# Patient Record
Sex: Female | Born: 1950 | Race: White | Hispanic: No | Marital: Married | Smoking: Never smoker
Health system: Southern US, Community
[De-identification: ages and names within clinical notes are randomized; demographics above are authoritative.]

## PROBLEM LIST (undated history)

## (undated) DIAGNOSIS — T7840XA Allergy, unspecified, initial encounter: Secondary | ICD-10-CM

## (undated) DIAGNOSIS — K449 Diaphragmatic hernia without obstruction or gangrene: Secondary | ICD-10-CM

## (undated) DIAGNOSIS — R059 Cough, unspecified: Secondary | ICD-10-CM

## (undated) DIAGNOSIS — Z0000 Encounter for general adult medical examination without abnormal findings: Secondary | ICD-10-CM

## (undated) DIAGNOSIS — R51 Headache: Secondary | ICD-10-CM

## (undated) DIAGNOSIS — K589 Irritable bowel syndrome without diarrhea: Secondary | ICD-10-CM

## (undated) DIAGNOSIS — I10 Essential (primary) hypertension: Secondary | ICD-10-CM

## (undated) DIAGNOSIS — K219 Gastro-esophageal reflux disease without esophagitis: Secondary | ICD-10-CM

## (undated) DIAGNOSIS — E669 Obesity, unspecified: Secondary | ICD-10-CM

## (undated) DIAGNOSIS — J310 Chronic rhinitis: Secondary | ICD-10-CM

## (undated) DIAGNOSIS — M1990 Unspecified osteoarthritis, unspecified site: Secondary | ICD-10-CM

## (undated) DIAGNOSIS — H269 Unspecified cataract: Secondary | ICD-10-CM

## (undated) DIAGNOSIS — K5792 Diverticulitis of intestine, part unspecified, without perforation or abscess without bleeding: Secondary | ICD-10-CM

## (undated) DIAGNOSIS — K5790 Diverticulosis of intestine, part unspecified, without perforation or abscess without bleeding: Secondary | ICD-10-CM

## (undated) DIAGNOSIS — R05 Cough: Secondary | ICD-10-CM

## (undated) DIAGNOSIS — E785 Hyperlipidemia, unspecified: Secondary | ICD-10-CM

## (undated) HISTORY — DX: Unspecified osteoarthritis, unspecified site: M19.90

## (undated) HISTORY — DX: Diverticulosis of intestine, part unspecified, without perforation or abscess without bleeding: K57.90

## (undated) HISTORY — PX: EYE SURGERY: SHX253

## (undated) HISTORY — DX: Gastro-esophageal reflux disease without esophagitis: K21.9

## (undated) HISTORY — DX: Diverticulitis of intestine, part unspecified, without perforation or abscess without bleeding: K57.92

## (undated) HISTORY — PX: CHOLECYSTECTOMY: SHX55

## (undated) HISTORY — DX: Diaphragmatic hernia without obstruction or gangrene: K44.9

## (undated) HISTORY — DX: Irritable bowel syndrome, unspecified: K58.9

## (undated) HISTORY — DX: Cough, unspecified: R05.9

## (undated) HISTORY — DX: Hyperlipidemia, unspecified: E78.5

## (undated) HISTORY — DX: Encounter for general adult medical examination without abnormal findings: Z00.00

## (undated) HISTORY — DX: Obesity, unspecified: E66.9

## (undated) HISTORY — PX: BILATERAL CARPAL TUNNEL RELEASE: SHX6508

## (undated) HISTORY — DX: Cough: R05

## (undated) HISTORY — DX: Allergy, unspecified, initial encounter: T78.40XA

## (undated) HISTORY — PX: BACK SURGERY: SHX140

## (undated) HISTORY — PX: COLONOSCOPY: SHX174

## (undated) HISTORY — DX: Essential (primary) hypertension: I10

## (undated) HISTORY — DX: Headache: R51

## (undated) HISTORY — DX: Unspecified cataract: H26.9

## (undated) HISTORY — DX: Chronic rhinitis: J31.0

---

## 1998-01-14 ENCOUNTER — Ambulatory Visit (HOSPITAL_COMMUNITY): Admission: RE | Admit: 1998-01-14 | Discharge: 1998-01-14 | Payer: Self-pay | Admitting: Neurosurgery

## 1998-01-14 ENCOUNTER — Encounter: Payer: Self-pay | Admitting: Neurosurgery

## 1998-01-31 ENCOUNTER — Ambulatory Visit (HOSPITAL_COMMUNITY): Admission: RE | Admit: 1998-01-31 | Discharge: 1998-01-31 | Payer: Self-pay | Admitting: Neurosurgery

## 1998-01-31 ENCOUNTER — Encounter: Payer: Self-pay | Admitting: Neurosurgery

## 1998-03-19 ENCOUNTER — Inpatient Hospital Stay (HOSPITAL_COMMUNITY): Admission: RE | Admit: 1998-03-19 | Discharge: 1998-03-21 | Payer: Self-pay | Admitting: Neurosurgery

## 1998-03-19 ENCOUNTER — Encounter: Payer: Self-pay | Admitting: Neurosurgery

## 1998-05-04 ENCOUNTER — Encounter: Payer: Self-pay | Admitting: Neurosurgery

## 1998-05-04 ENCOUNTER — Ambulatory Visit (HOSPITAL_COMMUNITY): Admission: RE | Admit: 1998-05-04 | Discharge: 1998-05-04 | Payer: Self-pay | Admitting: Neurosurgery

## 1998-06-17 ENCOUNTER — Ambulatory Visit (HOSPITAL_COMMUNITY): Admission: RE | Admit: 1998-06-17 | Discharge: 1998-06-17 | Payer: Self-pay | Admitting: Neurosurgery

## 1998-06-17 ENCOUNTER — Encounter: Payer: Self-pay | Admitting: Neurosurgery

## 2000-03-06 ENCOUNTER — Ambulatory Visit (HOSPITAL_COMMUNITY): Admission: RE | Admit: 2000-03-06 | Discharge: 2000-03-06 | Payer: Self-pay | Admitting: Obstetrics and Gynecology

## 2000-12-06 ENCOUNTER — Other Ambulatory Visit: Admission: RE | Admit: 2000-12-06 | Discharge: 2000-12-06 | Payer: Self-pay | Admitting: Internal Medicine

## 2001-04-20 ENCOUNTER — Ambulatory Visit (HOSPITAL_COMMUNITY): Admission: RE | Admit: 2001-04-20 | Discharge: 2001-04-20 | Payer: Self-pay | Admitting: Internal Medicine

## 2001-12-06 ENCOUNTER — Ambulatory Visit (HOSPITAL_COMMUNITY): Admission: RE | Admit: 2001-12-06 | Discharge: 2001-12-06 | Payer: Self-pay | Admitting: Internal Medicine

## 2002-03-24 ENCOUNTER — Emergency Department (HOSPITAL_COMMUNITY): Admission: EM | Admit: 2002-03-24 | Discharge: 2002-03-24 | Payer: Self-pay | Admitting: Emergency Medicine

## 2002-03-25 ENCOUNTER — Ambulatory Visit (HOSPITAL_COMMUNITY): Admission: RE | Admit: 2002-03-25 | Discharge: 2002-03-25 | Payer: Self-pay | Admitting: Emergency Medicine

## 2002-03-25 ENCOUNTER — Encounter: Payer: Self-pay | Admitting: Emergency Medicine

## 2002-04-03 ENCOUNTER — Encounter (INDEPENDENT_AMBULATORY_CARE_PROVIDER_SITE_OTHER): Payer: Self-pay | Admitting: Specialist

## 2002-04-03 ENCOUNTER — Encounter: Payer: Self-pay | Admitting: General Surgery

## 2002-04-03 ENCOUNTER — Ambulatory Visit (HOSPITAL_COMMUNITY): Admission: RE | Admit: 2002-04-03 | Discharge: 2002-04-04 | Payer: Self-pay | Admitting: General Surgery

## 2002-04-06 ENCOUNTER — Encounter: Payer: Self-pay | Admitting: Surgery

## 2002-04-06 ENCOUNTER — Observation Stay (HOSPITAL_COMMUNITY): Admission: EM | Admit: 2002-04-06 | Discharge: 2002-04-07 | Payer: Self-pay | Admitting: Emergency Medicine

## 2004-04-06 ENCOUNTER — Ambulatory Visit: Payer: Self-pay | Admitting: Internal Medicine

## 2004-04-12 ENCOUNTER — Ambulatory Visit: Payer: Self-pay | Admitting: Internal Medicine

## 2004-05-06 ENCOUNTER — Ambulatory Visit: Payer: Self-pay | Admitting: Internal Medicine

## 2004-05-16 ENCOUNTER — Emergency Department (HOSPITAL_COMMUNITY): Admission: EM | Admit: 2004-05-16 | Discharge: 2004-05-16 | Payer: Self-pay | Admitting: Emergency Medicine

## 2004-05-17 ENCOUNTER — Ambulatory Visit: Payer: Self-pay | Admitting: Internal Medicine

## 2004-05-19 ENCOUNTER — Ambulatory Visit: Payer: Self-pay | Admitting: Internal Medicine

## 2004-05-25 ENCOUNTER — Ambulatory Visit (HOSPITAL_COMMUNITY): Admission: RE | Admit: 2004-05-25 | Discharge: 2004-05-25 | Payer: Self-pay | Admitting: Internal Medicine

## 2004-06-08 ENCOUNTER — Ambulatory Visit: Payer: Self-pay | Admitting: Internal Medicine

## 2004-06-29 ENCOUNTER — Ambulatory Visit: Payer: Self-pay | Admitting: Internal Medicine

## 2005-05-31 ENCOUNTER — Ambulatory Visit: Payer: Self-pay | Admitting: Internal Medicine

## 2005-07-29 ENCOUNTER — Ambulatory Visit (HOSPITAL_COMMUNITY): Admission: RE | Admit: 2005-07-29 | Discharge: 2005-07-29 | Payer: Self-pay | Admitting: Internal Medicine

## 2006-03-20 ENCOUNTER — Ambulatory Visit: Payer: Self-pay | Admitting: Internal Medicine

## 2006-04-03 ENCOUNTER — Ambulatory Visit: Payer: Self-pay | Admitting: Internal Medicine

## 2006-05-18 ENCOUNTER — Ambulatory Visit: Payer: Self-pay | Admitting: Internal Medicine

## 2006-05-18 LAB — CONVERTED CEMR LAB
AST: 28 units/L (ref 0–37)
Albumin: 4 g/dL (ref 3.5–5.2)
Alkaline Phosphatase: 81 units/L (ref 39–117)
Basophils Relative: 0.9 % (ref 0.0–1.0)
Bilirubin, Direct: 0.1 mg/dL (ref 0.0–0.3)
Calcium: 9.4 mg/dL (ref 8.4–10.5)
Chloride: 107 meq/L (ref 96–112)
Cholesterol: 246 mg/dL (ref 0–200)
Direct LDL: 172.8 mg/dL
Eosinophils Relative: 2.5 % (ref 0.0–5.0)
HDL: 45.7 mg/dL (ref >39.0)
LDL Cholesterol: 165 mg/dL — ABNORMAL HIGH (ref 0–99)
Lymphocytes Relative: 26.6 % (ref 12.0–46.0)
MCHC: 34.3 g/dL (ref 30.0–36.0)
Monocytes Absolute: 0.5 10*3/uL (ref 0.2–0.7)
Neutro Abs: 4.3 10*3/uL (ref 1.4–7.7)
Neutrophils Relative %: 62.5 % (ref 43.0–77.0)
RBC: 4.86 M/uL (ref 3.87–5.11)
TSH: 1.45 microintl units/mL (ref 0.35–5.50)
Total Protein: 7.2 g/dL (ref 6.0–8.3)
Triglycerides: 177 mg/dL — ABNORMAL HIGH (ref 0–149)

## 2006-06-30 ENCOUNTER — Ambulatory Visit: Payer: Self-pay | Admitting: Internal Medicine

## 2006-08-10 ENCOUNTER — Ambulatory Visit (HOSPITAL_COMMUNITY): Admission: RE | Admit: 2006-08-10 | Discharge: 2006-08-10 | Payer: Self-pay | Admitting: Internal Medicine

## 2006-09-01 ENCOUNTER — Ambulatory Visit: Payer: Self-pay | Admitting: Internal Medicine

## 2007-01-01 ENCOUNTER — Ambulatory Visit: Payer: Self-pay | Admitting: Pulmonary Disease

## 2007-01-18 DIAGNOSIS — E785 Hyperlipidemia, unspecified: Secondary | ICD-10-CM | POA: Insufficient documentation

## 2007-01-18 DIAGNOSIS — R519 Headache, unspecified: Secondary | ICD-10-CM | POA: Insufficient documentation

## 2007-01-18 DIAGNOSIS — K449 Diaphragmatic hernia without obstruction or gangrene: Secondary | ICD-10-CM | POA: Insufficient documentation

## 2007-01-18 DIAGNOSIS — J310 Chronic rhinitis: Secondary | ICD-10-CM

## 2007-01-18 DIAGNOSIS — R05 Cough: Secondary | ICD-10-CM

## 2007-01-18 DIAGNOSIS — R51 Headache: Secondary | ICD-10-CM | POA: Insufficient documentation

## 2007-01-18 DIAGNOSIS — K589 Irritable bowel syndrome without diarrhea: Secondary | ICD-10-CM

## 2007-01-18 DIAGNOSIS — E669 Obesity, unspecified: Secondary | ICD-10-CM | POA: Insufficient documentation

## 2007-01-23 ENCOUNTER — Telehealth (INDEPENDENT_AMBULATORY_CARE_PROVIDER_SITE_OTHER): Payer: Self-pay | Admitting: *Deleted

## 2007-04-23 ENCOUNTER — Encounter: Payer: Self-pay | Admitting: Internal Medicine

## 2007-05-10 ENCOUNTER — Ambulatory Visit (HOSPITAL_BASED_OUTPATIENT_CLINIC_OR_DEPARTMENT_OTHER): Admission: RE | Admit: 2007-05-10 | Discharge: 2007-05-10 | Payer: Self-pay | Admitting: Orthopedic Surgery

## 2007-06-07 ENCOUNTER — Encounter: Payer: Self-pay | Admitting: Internal Medicine

## 2007-06-07 ENCOUNTER — Ambulatory Visit (HOSPITAL_BASED_OUTPATIENT_CLINIC_OR_DEPARTMENT_OTHER): Admission: RE | Admit: 2007-06-07 | Discharge: 2007-06-07 | Payer: Self-pay | Admitting: Orthopedic Surgery

## 2007-08-03 ENCOUNTER — Ambulatory Visit: Payer: Self-pay | Admitting: Internal Medicine

## 2007-08-03 DIAGNOSIS — L259 Unspecified contact dermatitis, unspecified cause: Secondary | ICD-10-CM

## 2007-08-14 ENCOUNTER — Ambulatory Visit (HOSPITAL_COMMUNITY): Admission: RE | Admit: 2007-08-14 | Discharge: 2007-08-14 | Payer: Self-pay | Admitting: Internal Medicine

## 2007-12-25 ENCOUNTER — Ambulatory Visit: Payer: Self-pay | Admitting: Internal Medicine

## 2007-12-25 DIAGNOSIS — M25559 Pain in unspecified hip: Secondary | ICD-10-CM | POA: Insufficient documentation

## 2008-05-19 ENCOUNTER — Ambulatory Visit: Payer: Self-pay | Admitting: Internal Medicine

## 2008-05-19 ENCOUNTER — Telehealth (INDEPENDENT_AMBULATORY_CARE_PROVIDER_SITE_OTHER): Payer: Self-pay | Admitting: *Deleted

## 2008-11-06 ENCOUNTER — Encounter: Payer: Self-pay | Admitting: Internal Medicine

## 2009-01-28 ENCOUNTER — Encounter (INDEPENDENT_AMBULATORY_CARE_PROVIDER_SITE_OTHER): Payer: Self-pay | Admitting: *Deleted

## 2009-05-20 ENCOUNTER — Ambulatory Visit: Payer: Self-pay | Admitting: Internal Medicine

## 2009-05-20 DIAGNOSIS — D179 Benign lipomatous neoplasm, unspecified: Secondary | ICD-10-CM | POA: Insufficient documentation

## 2009-05-20 DIAGNOSIS — I10 Essential (primary) hypertension: Secondary | ICD-10-CM

## 2009-05-21 LAB — CONVERTED CEMR LAB
ALT: 30 units/L (ref 0–35)
AST: 24 units/L (ref 0–37)
Alkaline Phosphatase: 72 units/L (ref 39–117)
Basophils Relative: 0.4 % (ref 0.0–3.0)
CO2: 32 meq/L (ref 19–32)
Chloride: 105 meq/L (ref 96–112)
Cholesterol: 225 mg/dL — ABNORMAL HIGH (ref 0–200)
Eosinophils Absolute: 0.1 10*3/uL (ref 0.0–0.7)
Eosinophils Relative: 1.4 % (ref 0.0–5.0)
GFR calc non Af Amer: 77.55 mL/min (ref >60)
HDL: 49.7 mg/dL (ref >39.00)
Lymphocytes Relative: 28.7 % (ref 12.0–46.0)
MCHC: 34.2 g/dL (ref 30.0–36.0)
MCV: 91.2 fL (ref 78.0–100.0)
Monocytes Relative: 7 % (ref 3.0–12.0)
Platelets: 234 10*3/uL (ref 150.0–400.0)
Potassium: 4 meq/L (ref 3.5–5.1)
RBC: 4.66 M/uL (ref 3.87–5.11)
RDW: 13.2 % (ref 11.5–14.6)
Sodium: 144 meq/L (ref 135–145)
Total Bilirubin: 0.6 mg/dL (ref 0.3–1.2)
Total Protein: 7.1 g/dL (ref 6.0–8.3)
Triglycerides: 131 mg/dL (ref 0.0–149.0)

## 2009-05-25 ENCOUNTER — Encounter (INDEPENDENT_AMBULATORY_CARE_PROVIDER_SITE_OTHER): Payer: Self-pay | Admitting: *Deleted

## 2009-05-27 ENCOUNTER — Telehealth (INDEPENDENT_AMBULATORY_CARE_PROVIDER_SITE_OTHER): Payer: Self-pay | Admitting: *Deleted

## 2009-06-23 ENCOUNTER — Encounter (INDEPENDENT_AMBULATORY_CARE_PROVIDER_SITE_OTHER): Payer: Self-pay | Admitting: *Deleted

## 2009-06-25 ENCOUNTER — Ambulatory Visit: Payer: Self-pay | Admitting: Gastroenterology

## 2009-07-02 ENCOUNTER — Telehealth: Payer: Self-pay | Admitting: Internal Medicine

## 2009-07-09 ENCOUNTER — Ambulatory Visit: Payer: Self-pay | Admitting: Gastroenterology

## 2009-07-13 ENCOUNTER — Encounter: Payer: Self-pay | Admitting: Gastroenterology

## 2009-07-21 ENCOUNTER — Encounter: Payer: Self-pay | Admitting: Internal Medicine

## 2009-08-24 ENCOUNTER — Ambulatory Visit: Payer: Self-pay | Admitting: Internal Medicine

## 2009-08-24 LAB — CONVERTED CEMR LAB
ALT: 25 units/L (ref 0–35)
Alkaline Phosphatase: 79 units/L (ref 39–117)
Basophils Relative: 0.4 % (ref 0.0–3.0)
Bilirubin, Direct: 0 mg/dL (ref 0.0–0.3)
Chloride: 109 meq/L (ref 96–112)
Eosinophils Relative: 1.9 % (ref 0.0–5.0)
GFR calc non Af Amer: 76.38 mL/min (ref >60)
Glucose, Bld: 101 mg/dL — ABNORMAL HIGH (ref 70–99)
HCT: 42.2 % (ref 36.0–46.0)
HDL: 41.7 mg/dL (ref >39.00)
Hemoglobin: 14.5 g/dL (ref 12.0–15.0)
Monocytes Absolute: 0.6 10*3/uL (ref 0.1–1.0)
Monocytes Relative: 7.9 % (ref 3.0–12.0)
Neutrophils Relative %: 60.9 % (ref 43.0–77.0)
Platelets: 234 10*3/uL (ref 150.0–400.0)
RDW: 12.7 % (ref 11.5–14.6)
Specific Gravity, Urine: 1.02 (ref 1.000–1.030)
TSH: 1.78 microintl units/mL (ref 0.35–5.50)
Total CHOL/HDL Ratio: 6
Total Protein, Urine: NEGATIVE mg/dL
Total Protein: 7 g/dL (ref 6.0–8.3)
Triglycerides: 221 mg/dL — ABNORMAL HIGH (ref 0.0–149.0)
Urine Glucose: NEGATIVE mg/dL

## 2009-08-25 LAB — CONVERTED CEMR LAB: Vit D, 25-Hydroxy: 42 ng/mL (ref 30–89)

## 2009-11-12 ENCOUNTER — Encounter: Payer: Self-pay | Admitting: Internal Medicine

## 2010-03-14 LAB — CONVERTED CEMR LAB
AST: 21 units/L (ref 0–37)
Albumin: 3.7 g/dL (ref 3.5–5.2)
Alkaline Phosphatase: 66 units/L (ref 39–117)
Basophils Absolute: 0 10*3/uL (ref 0.0–0.1)
Basophils Relative: 0.4 % (ref 0.0–3.0)
Bilirubin, Direct: 0.1 mg/dL (ref 0.0–0.3)
Calcium: 9.1 mg/dL (ref 8.4–10.5)
Chloride: 108 meq/L (ref 96–112)
Eosinophils Absolute: 0.1 10*3/uL (ref 0.0–0.7)
Eosinophils Relative: 2.2 % (ref 0.0–5.0)
GFR calc non Af Amer: 91 mL/min
HCT: 41.7 % (ref 36.0–46.0)
HDL: 34.4 mg/dL — ABNORMAL LOW (ref >39.0)
MCHC: 33.8 g/dL (ref 30.0–36.0)
Neutro Abs: 3.9 10*3/uL (ref 1.4–7.7)
Platelets: 207 10*3/uL (ref 150–400)
RBC: 4.51 M/uL (ref 3.87–5.11)
Sodium: 143 meq/L (ref 135–145)
Triglycerides: 107 mg/dL (ref 0–149)

## 2010-03-16 NOTE — Assessment & Plan Note (Signed)
Summary: Primary svc/ cpx     Primary Provider/Referring Provider:  [name]   History of Present Illness: 69  yowf never smoker with moderated obesity and tendency to cyclical coughing and both chronic and seasonal rhinitis.  December 25, 2007 CPX:  co worsening nasal congestion with no response to clariton, afrin. Does not remember previous disucussion of nasal steroids or flyer reviewed in 2006.  No ha or purulent secretions, sob, cp or sign increase cough, fever, ear problems. rec optimal topical rx with nasal steroids.   May 19, 2008 ov 4/1 cleaned back yard, then 4/3 broke out in itching rash mostly right arm and around right eyelid.  No sob, cough. rx as contact dermatitis and resolved.  May 20, 2009 Yearly followup.  Pt c/o sneezing and runny nose x several days.  She c/o "knot" on her right side she noticed a few months ago.   imp ? hernia s/p remote lap chole and elevated so watch salt   August 24, 2009 cpx no new c/os - rlight uq tingling twice monthly lasts a few hours, no pattern.  Current Medications (verified): 1)  Zoloft 100 Mg  Tabs (Sertraline Hcl) .... Take 1 Tablet By Mouth Once A Day 2)  Protonix 40 Mg  Tbec (Pantoprazole Sodium) .... Take  One 30-60 Min Before First Meal of The Day 3)  Multivitamins  Tabs (Multiple Vitamin) .Marland Kitchen.. 1 Once Daily 4)  Caltrate 600 1500 Mg Tabs (Calcium Carbonate) .Marland Kitchen.. 1 Once Daily 5)  Nasonex 50 Mcg/act  Susp (Mometasone Furoate) .... Two Puffs Each Nostril Twice Daily As Needed 6)  Delsym 30 Mg/6ml  Lqcr (Dextromethorphan Polistirex) .... As Needed 7)  Ibuprofen 800 Mg  Tabs (Ibuprofen) .... Three Times A Day As Needed 8)  Claritin 10 Mg  Tabs (Loratadine) .... Take 1 Tablet Daily As Needed For Itching Sneezing and Runny Nose 9)  B Complex-B12  Tabs (B Complex Vitamins) .Marland Kitchen.. 1 Once Daily  Allergies (verified): 1)  ! Prozac  Past History:  Past Medical History: 1. Obesity with an ideal weight less than 171, target less than 190 to  get BMI < 30 2. Nonspecific rhinitis      - CRF given 04/06/04      - CRF repeated December 25, 2007  3. Cyclical cough felt to be partially reflux in the past; see     endoscopy January 2001 consistent with a small hiatal hernia. 4aHiatal hernia. Pos egd 1/01................................................................Marland KitchenStark 4b Diverticulosis pos colonoscopy 02/1999............................................Marland KitchenStark 5. Irritable bowel syndrome. 6. Hyperlipidemia, target LDL less than 160 based on the absence of     identifiable risk factors. 7. Recent headaches (see most recent office visit April 03, 2006),     completely resolved with Midrin and does not require daily dosing. 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 Nl external ear canals without cough reflex, the right ear was examined twice with no findings and note pain on retraction of the earlobe or reproduced by opening the jaw Neck without JVD/Nodes/TM, but there was  subtle soft tissue swelling below the right ear. Lungs clear to A and P bilaterally without cough on insp or exp maneuvers RRR no s3 or murmur or increase in P2 Abd soft and benign with nl excursion in the supine position. No bruits or organomegaly Skin with nodule right flank large margle sized, non tender, no discoloration Cholesterol          [H]  246 mg/dL                   4-098     ATP III Classification            Desirable:  < 200 mg/dL                    Borderline High:  200 - 239 mg/dL               High:  > = 240 mg/dL   Triglycerides        [H]  221.0 mg/dL                 1.1-914.7     Normal:  <150 mg/dL     Borderline High:  829 - 199 mg/dL   HDL                       56.21 mg/dL                 >30.86   VLDL Cholesterol     [H]  44.2 mg/dL                  5.7-84.6  CHO/HDL Ratio:  CHD Risk                             6                    Men          Women     1/2 Average Risk     3.4          3.3     Average Risk          5.0          4.4     2X Average Risk          9.6          7.1     3X Average Risk          15.0          11.0  Tests: (2) BMP (METABOL)   Sodium                    144 mEq/L                   135-145   Potassium                 4.5 mEq/L                   3.5-5.1   Chloride                  109 mEq/L                   96-112   Carbon Dioxide       [H]  33 mEq/L                    19-32   Glucose              [H]  101 mg/dL                   82-95   BUN                       12 mg/dL                    6-21   Creatinine                0.8 mg/dL                   3.0-8.6   Calcium                   9.2 mg/dL                   5.7-84.6   GFR                       76.38 mL/min                >60  Tests: (3) CBC Platelet w/Diff (CBCD)   White Cell Count          8.2 K/uL                    4.5-10.5   Red Cell Count            4.53 Mil/uL                 3.87-5.11   Hemoglobin                14.5 g/dL                    96.2-95.2   Hematocrit                42.2 %                      36.0-46.0   MCV                       93.1 fl                     78.0-100.0   MCHC  34.4 g/dL                   16.1-09.6   RDW                       12.7 %                      11.5-14.6   Platelet Count            234.0 K/uL                  150.0-400.0   Neutrophil %              60.9 %                      43.0-77.0   Lymphocyte %              28.9 %                      12.0-46.0   Monocyte %                7.9 %                       3.0-12.0   Eosinophils%              1.9 %                       0.0-5.0   Basophils %               0.4 %                       0.0-3.0   Neutrophill Absolute      5.0 K/uL                    1.4-7.7   Lymphocyte Absolute       2.4 K/uL                    0.7-4.0   Monocyte Absolute         0.6 K/uL                    0.1-1.0  Eosinophils, Absolute                             0.2 K/uL                    0.0-0.7   Basophils Absolute        0.0 K/uL                    0.0-0.1  Tests: (4) Hepatic/Liver Function Panel (HEPATIC)   Total Bilirubin           0.5 mg/dL                   0.4-5.4   Direct Bilirubin          0.0 mg/dL                   0.9-8.1   Alkaline Phosphatase      79 U/L  39-117   AST                       21 U/L                      0-37   ALT                       25 U/L                      0-35   Total Protein             7.0 g/dL                    0.9-8.1   Albumin                   4.1 g/dL                    1.9-1.4  Tests: (5) TSH (TSH)   FastTSH                   1.78 uIU/mL                 0.35-5.50  Tests: (6) UDip Only (UDIP)   Color                     YELLOW       RANGE:  Yellow;Lt. Yellow   Clarity                   CLEAR                       Clear   Specific Gravity          1.020                       1.000 - 1.030   Urine Ph                  7.0                         5.0-8.0   Protein                    NEGATIVE                    Negative   Urine Glucose             NEGATIVE                    Negative   Ketones                   NEGATIVE                    Negative   Urine Bilirubin           NEGATIVE                    Negative   Blood                     NEGATIVE                    Negative   Urobilinogen  0.2                         0.0 - 1.0   Leukocyte Esterace        NEGATIVE                    Negative   Nitrite                   NEGATIVE                    Negative  Tests: (7) Cholesterol LDL - Direct (DIRLDL)  Cholesterol LDL - Direct                             165.4 mg/dL  Impression & Recommendations:  Problem # 1:  HYPERLIPIDEMIA (ICD-272.4) 6. Hyperlipidemia, target LDL less than 160 based on the absence of     identifiable risk factors. Labs Reviewed: SGOT: 24 (05/20/2009)   SGPT: 30 (05/20/2009)   HDL:49.70 (05/20/2009), 34.4 (12/25/2007)  LDL:132 (12/25/2007), 165 (05/18/2006) >  LDL  165 August 24, 2009   needs diet and ex   Chol:225 (05/20/2009), 188 (12/25/2007)  Trig:131.0 (05/20/2009), 107 (12/25/2007)  Problem # 2:  RHINITIS (ICD-472.0) rx reviewed  Problem # 3:  HIATAL HERNIA (ICD-553.3)  Her updated medication list for this problem includes:    Protonix 40 Mg Tbec (Pantoprazole sodium) .Marland Kitchen... Take  one 30-60 min before first meal of the day  EGD reviewed rx per Dr Russella Dar  Other Orders: EKG w/ Interpretation (93000) T-Vitamin D (25-Hydroxy) ([address]-60[address]) TLB-Lipid Panel (80061-LIPID) TLB-BMP (Basic Metabolic Panel-BMET) (80048-METABOL) TLB-CBC Platelet - w/Differential (85025-CBCD) TLB-Hepatic/Liver Function Pnl (80076-HEPATIC) TLB-TSH (Thyroid Stimulating Hormone) (84443-TSH) TLB-Udip ONLY (81003-UDIP) Est. Patient 40-64 years (09811)  Patient Instructions: 1)  We can do a formulary substitution for your protonix or will need prior approval by Dr Russella Dar or can use prilosec otc 20 mg per day as an alternative 2)  Weight control is simply  a matter of calorie balance which needs to be tilted in your favor by eating less and exercising more.  To get the most out of exercise, you need to be continuously aware that you are short of breath, but never out of breath, for 30 minutes daily. As you improve, it will actually be easier for you to do the same amount in  30 minutes so always push to the level where you are short of breath.  If this does not result in gradual weight reduction,  I recommend  a nutritionist for a food diary 3)  If need more than 800 advil a day consistently see Dr Renae Fickle 4)  Call [phone] for your results w/in next 3 days - if there's something important  I feel you need to know,  I'll be in touch with you directly.    [facility] ECG  ID: [id number] Patient: Savannah [name], Savannah [name] DOB: 05-16-1950 Age: 60 Years Old Sex: Female Race: White Height: 68 Weight: 206 Status: Unconfirmed Past Medical History:  1. Obesity with an ideal weight less than 171, target less than 190 to get BMI < 30 2. Nonspecific rhinitis      - CRF given 04/06/04      - CRF repeated December 25, 2007  3. Cyclical cough felt to be partially reflux in the past; see     endoscopy January 2001 consistent with a small hiatal hernia.  4aHiatal hernia. Pos egd 1/01................................................................Marland KitchenStark 4b Diverticulosis pos colonoscopy 02/1999............................................Marland KitchenStark 5. Irritable bowel syndrome. 6. Hyperlipidemia, target LDL less than 160 based on the absence of     identifiable risk factors. 7. Recent headaches (see most recent office visit April 03, 2006),     completely resolved with Midrin and does not require daily dosing. HEALTH MAINTENANCE........................................................................Marland KitchenWert     - TD 2/05    - Pneumovax 4/07    -  DEXA 5/08 wnl    - CPX  12/25/2007    - GYN rx per Ambrose Mantle   Recorded: 08/24/2009 09:10 AM P/PR: 120 ms / 177 ms - Heart rate  (maximum exercise) QRS: 90 QT/QTc/QTd: 421 ms / 423 ms / 95 ms - Heart rate (maximum exercise)  P/QRS/T axis: 21 deg / -29 deg / 38 deg - Heart rate (maximum exercise)  Heartrate: 61 bpm  Interpretation:   sinus rhythm  horizontal axis   Normal variant of ECG

## 2010-03-16 NOTE — Progress Notes (Signed)
Summary: Maura Crandall for Pantoprazole sodium   Phone Note Outgoing Call   Call placed by: Carver Fila Jul 02, 2009 3:31 PM Summary of Call: Attempt to call twice but no answer to [employer] for prior auth on Pantoprazole Sodium 40mg  Carver Fila SMA  Jul 02, 2009 3:32 PM   Follow-up for Phone Call        Called [employer] to initiate PA for pantoprazole 40mg .  Awaiting on form to be sent to triage.  Gweneth Dimitri RN  Jul 03, 2009 4:47 PM  Form received and placed in MW's to do pile.  Gweneth Dimitri RN  Jul 03, 2009 4:56 PM   approval form has been reveived. [facility] and pt notified. Carron Curie CMA  July 16, 2009 3:02 PM

## 2010-03-16 NOTE — Letter (Signed)
Summary: Previsit letter  [facility]  [address] [place], [place] [address]   Phone: [phone]  Fax: [phone]       05/25/2009 MRN: [id number]  Savannah [name] [address] [place], [place]  [address]  Dear Ms. Beckstrom,  Welcome to the Gastroenterology Division at [facility].    You are scheduled to see a nurse for your pre-procedure visit on 06-25-09 at 4:30pm on the 3rd floor at [facility], [address]  We ask that you try to arrive at our office 15 minutes prior to your appointment time to allow for check-in.  Your nurse visit will consist of discussing your medical and surgical history, your immediate family medical history, and your medications.    Please bring a complete list of all your medications or, if you prefer, bring the medication bottles and we will list them.  We will need to be aware of both prescribed and over the counter drugs.  We will need to know exact dosage information as well.  If you are on blood thinners (Coumadin, Plavix, Aggrenox, Ticlid, etc.) please call our office today/prior to your appointment, as we need to consult with your physician about holding your medication.   Please be prepared to read and sign documents such as consent forms, a financial agreement, and acknowledgement forms.  If necessary, and with your consent, a friend or relative is welcome to sit-in on the nurse visit with you.  Please bring your insurance card so that we may make a copy of it.  If your insurance requires a referral to see a specialist, please bring your referral form from your primary care physician.  No co-pay is required for this nurse visit.     If you cannot keep your appointment, please call [phone] to cancel or reschedule prior to your appointment date.  This allows [place] the opportunity to schedule an appointment for another patient in need of care.    Thank you for choosing [facility] for your medical needs.   We appreciate the opportunity to care for you.  Please visit [place] at our website  to learn more about our practice.                     Sincerely.                                                                                                                   The Gastroenterology Division

## 2010-03-16 NOTE — Letter (Signed)
Summary: Patient Notice- Polyp Results  [facility]  [address] [place], [place] [address]   Phone: [phone]  Fax: [phone]        Jul 13, 2009 MRN: [id number]    Savannah [name] [address] [place], [place]  [address]    Dear [name],  I am pleased to inform you that the colon polyp(s) removed during your recent colonoscopy was (were) found to be benign (no cancer detected) upon pathologic examination.  I recommend you have a repeat colonoscopy examination in 3 years to look for recurrent polyps, as having colon polyps increases your risk for having recurrent polyps or even colon cancer in the future.  Should you develop new or worsening symptoms of abdominal pain, bowel habit changes or bleeding from the rectum or bowels, please schedule an evaluation with either your primary care physician or with me.  Continue treatment plan as outlined the day of your exam.  Please call us if you are having persistent problems or have questions about your condition that have not been fully answered at this time.  Sincerely,  Meryl Dare MD [facility]  This letter has been electronically signed by your physician.  Appended Document: Patient Notice- Polyp Results letter mailed.

## 2010-03-16 NOTE — Progress Notes (Signed)
Summary: refill  Phone Note Call from Patient Call back at [phone]   Caller: Patient Call For: wert Summary of Call: need refill on zoloft [facility] Initial call taken by: Rickard Patience,  May 27, 2009 4:40 PM  Follow-up for Phone Call        Rx was refilled.  Spoke with pt and advised that this has been done. Follow-up by: Vernie Murders,  May 27, 2009 4:51 PM    Prescriptions: ZOLOFT 100 MG  TABS (SERTRALINE HCL) Take 1 tablet by mouth once a day  #30 x 11   Entered by:   Vernie Murders   Authorized by:   Nyoka Cowden MD   Signed by:   Vernie Murders on 05/27/2009   Method used:   Electronically to        [facility]* (retail)       [address] [place], [place]  [address]       Ph: [phone] or [phone]       Fax: [phone]   RxID:   [id number]

## 2010-03-16 NOTE — Letter (Signed)
Summary: [facility] Instructions  [facility]  [address] [place], [place] [address]   Phone: [phone]  Fax: [phone]       Navea STARON    1950-09-13    MRN: [id number]        Procedure Day Dorna Bloom:  Lenor Coffin  07/09/09     Arrival Time:  8:00am     Procedure Time:  9:00am     Location of Procedure:                    [facility] (4th Floor)                       PREPARATION FOR COLONOSCOPY WITH MOVIPREP   Starting 5 days prior to your procedure  SATURDAY 05/21  do not eat nuts, seeds, popcorn, corn, beans, peas,  salads, or any raw vegetables.  Do not take any fiber supplements (e.g. Metamucil, Citrucel, and Benefiber).  THE DAY BEFORE YOUR PROCEDURE         DATE: [facility] 05/25   1.  Drink clear liquids the entire day-NO SOLID FOOD  2.  Do not drink anything colored red or purple.  Avoid juices with pulp.  No orange juice.  3.  Drink at least 64 oz. (8 glasses) of fluid/clear liquids during the day to prevent dehydration and help the prep work efficiently.  CLEAR LIQUIDS INCLUDE: Water Jello Ice Popsicles Tea (sugar ok, no milk/cream) Powdered fruit flavored drinks Coffee (sugar ok, no milk/cream) Gatorade Juice: apple, white grape, white cranberry  Lemonade Clear bullion, consomm, broth Carbonated beverages (any kind) Strained chicken noodle soup Hard Candy                             4.  In the morning, mix first dose of MoviPrep solution:    Empty 1 Pouch A and 1 Pouch B into the disposable container    Add lukewarm drinking water to the top line of the container. Mix to dissolve    Refrigerate (mixed solution should be used within 24 hrs)  5.  Begin drinking the prep at 5:00 p.m. The MoviPrep container is divided by 4 marks.   Every 15 minutes drink the solution down to the next mark (approximately 8 oz) until the full liter is complete.   6.  Follow completed prep with 16 oz of clear liquid of your choice (Nothing  red or purple).  Continue to drink clear liquids until bedtime.  7.  Before going to bed, mix second dose of MoviPrep solution:    Empty 1 Pouch A and 1 Pouch B into the disposable container    Add lukewarm drinking water to the top line of the container. Mix to dissolve    Refrigerate  THE DAY OF YOUR PROCEDURE      DATE: THURSDAY  05/26  Beginning at   4:00 am  (5 hours before procedure):         1. Every 15 minutes, drink the solution down to the next mark (approx 8 oz) until the full liter is complete.  2. Follow completed prep with 16 oz. of clear liquid of your choice.    3. You may drink clear liquids until 7:00am  (2 HOURS BEFORE PROCEDURE).   MEDICATION INSTRUCTIONS  Unless otherwise instructed, you should take regular prescription medications with a small sip of water   as  early as possible the morning of your procedure.           OTHER INSTRUCTIONS  You will need a responsible adult at least 62 years of age to accompany you and drive you home.   This person must remain in the waiting room during your procedure.  Wear loose fitting clothing that is easily removed.  Leave jewelry and other valuables at home.  However, you may wish to bring a book to read or  an iPod/MP3 player to listen to music as you wait for your procedure to start.  Remove all body piercing jewelry and leave at home.  Total time from sign-in until discharge is approximately 2-3 hours.  You should go home directly after your procedure and rest.  You can resume normal activities the  day after your procedure.  The day of your procedure you should not:   Drive   Make legal decisions   Operate machinery   Drink alcohol   Return to work  You will receive specific instructions about eating, activities and medications before you leave.    The above instructions have been reviewed and explained to me by   Clide Cliff, RN______________________    I fully understand and can  verbalize these instructions _____________________________ Date _________

## 2010-03-16 NOTE — Medication Information (Signed)
Summary: Tax adviser   Imported By: Lehman Prom 07/21/2009 09:10:55  _____________________________________________________________________  External Attachment:    Type:   Image     Comment:   External Document

## 2010-03-16 NOTE — Procedures (Signed)
Summary: Colonoscopy  Patient: Savannah [name] Note: All result statuses are Final unless otherwise noted.  Tests: (1) Colonoscopy (COL)   COL Colonoscopy           DONE     [facility]     [address]     [place], [place]  [address]           COLONOSCOPY PROCEDURE REPORT           PATIENT:  [name], [name]  MR#:  [id number]     BIRTHDATE:  January 28, 1951, 58 yrs. old  GENDER:  female     ENDOSCOPIST:  Judie Petit T. Russella Dar, MD, [facility]           PROCEDURE DATE:  07/09/2009     PROCEDURE:  Colonoscopy with snare polypectomy     ASA CLASS:  Class II     INDICATIONS:  1) Routine Risk Screening     MEDICATIONS:   Fentanyl 50 mcg IV, Versed 7 mg IV     DESCRIPTION OF PROCEDURE:   After the risks benefits and     alternatives of the procedure were thoroughly explained, informed     consent was obtained.  Digital rectal exam was performed and     revealed no abnormalities.   The LB PCF-H180AL [id number] endoscope     was introduced through the anus and advanced to the cecum, which     was identified by both the appendix and ileocecal valve, without     limitations.  The quality of the prep was good, using MoviPrep.     The instrument was then slowly withdrawn as the colon was fully     examined.     <<PROCEDUREIMAGES>>     FINDINGS:  Three polyps were found in the transverse colon. They     were 4 - 6 mm in size. Polyps were snared without cautery.     Retrieval was successful. A pedunculated polyp was found in the     descending colon. It was 8 mm in size. Polyp was snared, then     cauterized with monopolar cautery. Retrieval was successful. Mild     diverticulosis was found in the sigmoid colon.  This was otherwise     a normal examination of the colon.   Retroflexed views in the     rectum revealed no abnormalities. The time to cecum =  2.5     minutes. The scope was then withdrawn (time =  13.5  min) from the     patient and the procedure completed.           COMPLICATIONS:  None        ENDOSCOPIC IMPRESSION:     1) 4 - 6 mm Three polyps in the transverse colon     2) 8 mm pedunculated polyp in the descending colon     3) Mild diverticulosis in the sigmoid colon           RECOMMENDATIONS:     1) No aspirin or NSAID's for 2 weeks     2) Await pathology results     3) High fiber diet with liberal fluid intake.     4) If the 3 or more polyps removed today are adenomatous     (pre-cancerous), you will need a colonoscopy in 3 years, if 1 or 2     are adenomatous a colonoscopy in 5 years. Otherwise you should     continue to follow colorectal cancer  screening guidelines for     "routine risk" patients with a colonoscopy in 10 years.           [name]. Russella Dar, MD, [facility]           CC: Nyoka Cowden, MD           n.     [name] DoctorVenita Lick. Hesham Womac at 07/09/2009 09:52 AM           [name], [id number]  Note: An exclamation mark (!) indicates a result that was not dispersed into the flowsheet. Document Creation Date: 07/09/2009 9:53 AM _______________________________________________________________________  (1) Order result status: Final Collection or observation date-time: 07/09/2009 09:41 Requested date-time:  Receipt date-time:  Reported date-time:  Referring Physician:   Ordering Physician: Claudette Head [id number]) Specimen Source:  Source: Launa Grill Order Number: [id number] Lab site:   Appended Document: Colonoscopy     Procedures Next Due Date:    Colonoscopy: 06/2012

## 2010-03-16 NOTE — Assessment & Plan Note (Signed)
Summary: Primary svc/ f/u ov new right abd wall lipoma   Primary Provider/Referring Provider:  Sherene Sires  CC:  Yearly followup.  Pt c/o sneezing and runny nose x several days.  She c/o "knot" on her right side she noticed a few months ago.  Marland Kitchen  History of Present Illness: 42  yowf never smoker with moderated obesity and tendency to cyclical coughing and both chronic and seasonal rhinitis.  December 25, 2007 CPX:  co worsening nasal congestion with no response to clariton, afrin. Does not remember previous disucussion of nasal steroids or flyer reviewed in 2006.  No ha or purulent secretions, sob, cp or sign increase cough, fever, ear problems. rec optimal topical rx with nasal steroids.   May 19, 2008 ov 4/1 cleaned back yard, then 4/3 broke out in itching rash mostly right arm and around right eyelid.  No sob, cough. rx as contact dermatitis and resolved.  May 20, 2009 Yearly followup.  Pt c/o sneezing and runny nose x several days.  She c/o "knot" on her right side she noticed a few months ago.   no gi symptoms, no excess cough or pain with coughing.  Pt denies any significant sore throat, dysphagia, itching, sneezing,  nasal congestion or excess secretions,  fever, chills, sweats, unintended wt loss, pleuritic or exertional cp, hempoptysis, change in activity tolerance  orthopnea pnd or leg swelling   Current Medications (verified): 1)  Zoloft 100 Mg  Tabs (Sertraline Hcl) .... Take 1 Tablet By Mouth Once A Day 2)  Protonix 40 Mg  Tbec (Pantoprazole Sodium) .... Take  One 30-60 Min Before First Meal of The Day 3)  Multivitamins  Tabs (Multiple Vitamin) .Marland Kitchen.. 1 Once Daily 4)  Caltrate 600 1500 Mg Tabs (Calcium Carbonate) .Marland Kitchen.. 1 Once Daily 5)  Nasonex 50 Mcg/act  Susp (Mometasone Furoate) .... Two Puffs Each Nostril Twice Daily As Needed 6)  Delsym 30 Mg/33ml  Lqcr (Dextromethorphan Polistirex) .... As Needed 7)  Ibuprofen 800 Mg  Tabs (Ibuprofen) .... Three Times A Day As Needed 8)  Claritin  10 Mg  Tabs (Loratadine) .... Take 1 Tablet Daily As Needed For Itching Sneezing and Runny Nose 9)  B Complex-B12  Tabs (B Complex Vitamins) .Marland Kitchen.. 1 Once Daily  Allergies (verified): 1)  ! Prozac  Past History:  Past Medical History: 1. Obesity with an ideal weight less than 171, target less than 190 to get BMI < 30 2. Nonspecific rhinitis      - CRF given 04/06/04      - CRF repeated December 25, 2007  3. Cyclical cough felt to be partially reflux in the past; see     endoscopy January 2001 consistent with a small hiatal hernia. 4aHiatal hernia. Pos egd 1/01................................................................Marland KitchenStark 4b Diverticulosis pos colonoscopy 02/1999............................................Marland KitchenStark 5. Irritable bowel syndrome. 6. Hyperlipidemia, target LDL less than 160 based on the absence of     identifiable risk factors. 7. Recent headaches (see most recent office visit April 03, 2006),     completely resolved with Midrin and does not require daily dosing. 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 Nl external  ear canals without cough reflex, the right ear was examined twice with no findings and note pain on retraction of the earlobe or reproduced by opening the jaw Neck without JVD/Nodes/TM, but there was subtle soft tissue swelling below the right ear. Lungs clear to A and P bilaterally without cough on insp or exp maneuvers RRR no s3 or murmur or increase in P2 Abd soft and benign with nl excursion in the supine position. No bruits or organomegaly Skin with nodule right flank large margle sized, non tender, no discoloration  Cholesterol          [H]  225 mg/dL                   4-132     ATP III Classification            Desirable:  < 200 mg/dL                    Borderline High:  200 - 239 mg/dL               High:  > = 240 mg/dL   Triglycerides             131.0 mg/dL                 4.4-010.2     Normal:  <150 mg/dL     Borderline High:  725 - 199 mg/dL   HDL                       36.64 mg/dL                 >40.34   VLDL Cholesterol          26.2 mg/dL                  7.4-25.9  CHO/HDL Ratio:  CHD Risk                             5                    Men          Women     1/2 Average Risk     3.4          3.3     Average Risk          5.0          4.4     2X Average Risk          9.6          7.1     3X Average Risk  15.0          11.0                           Tests: (2) BMP (METABOL)   Sodium                    144 mEq/L                   135-145   Potassium                 4.0 mEq/L                   3.5-5.1   Chloride                  105 mEq/L                   96-112   Carbon Dioxide            32 mEq/L                    19-32   Glucose                   99 mg/dL                    16-10   BUN                       13 mg/dL                    9-60   Creatinine                0.8 mg/dL                   4.5-4.0   Calcium                   9.1 mg/dL                   9.8-11.9   GFR                       77.55 mL/min                >60  Tests: (3) TSH (TSH)    FastTSH                   1.46 uIU/mL                 0.35-5.50  Tests: (4) Hepatic/Liver Function Panel (HEPATIC)   Total Bilirubin           0.6 mg/dL                   1.4-7.8   Direct Bilirubin          0.1 mg/dL                   2.9-5.6   Alkaline Phosphatase      72 U/L                      39-117   AST                       24 U/L  0-37   ALT                       30 U/L                      0-35   Total Protein             7.1 g/dL                    9.8-1.1   Albumin                   4.1 g/dL                    9.1-4.7  Tests: (5) CBC Platelet w/Diff (CBCD)   White Cell Count          7.2 K/uL                    4.5-10.5   Red Cell Count            4.66 Mil/uL                 3.87-5.11   Hemoglobin                14.5 g/dL                   82.9-56.2   Hematocrit                42.5 %                      36.0-46.0   MCV                       91.2 fl                     78.0-100.0   MCHC                      34.2 g/dL                   13.0-86.5   RDW                       13.2 %                      11.5-14.6   Platelet Count            234.0 K/uL                  150.0-400.0   Neutrophil %              62.5 %                      43.0-77.0   Lymphocyte %              28.7 %                      12.0-46.0   Monocyte %                7.0 %                       3.0-12.0   Eosinophils%  1.4 %                       0.0-5.0   Basophils %               0.4 %                       0.0-3.0   Neutrophill Absolute      4.5 K/uL                    1.4-7.7   Lymphocyte Absolute       2.1 K/uL                    0.7-4.0   Monocyte Absolute         0.5 K/uL                    0.1-1.0  Eosinophils, Absolute                             0.1 K/uL                    0.0-0.7   Basophils Absolute        0.0 K/uL                    0.0-0.1  Tests: (6) Full Range CRP (FCRP)   Full Range CRP            2.50 mg/L                   0.00-5.00     Note:  An elevated  hs-CRP (>5 mg/L) should be repeated after 2 weeks to rule out recent infection or trauma.  Tests: (7) Cholesterol LDL - Direct (DIRLDL)  Cholesterol LDL - Direct                             161.2 mg/dL  Impression & Recommendations:  Problem # 1:  HYPERLIPIDEMIA (ICD-272.4)  Target < 160 Orders: TLB-Lipid Panel (80061-LIPID) TLB-BMP (Basic Metabolic Panel-BMET) (80048-METABOL) TLB-TSH (Thyroid Stimulating Hormone) (84443-TSH) TLB-Hepatic/Liver Function Pnl (80076-HEPATIC) TLB-CBC Platelet - w/Differential (85025-CBCD) TLB-CRP-High Sensitivity (C-Reactive Protein) (86140-FCRP) Est. Patient Level IV (29528)  Labs Reviewed: SGOT: 21 (12/25/2007)   SGPT: 25 (12/25/2007)   HDL:34.4 (12/25/2007), 45.7 (05/18/2006)  LDL:132 (12/25/2007) > 161 May 20, 2009  165 (05/18/2006)  Chol:188 (12/25/2007), 246 (05/18/2006)  Trig:107 (12/25/2007), 177 (05/18/2006)  rec diet and ex  Problem # 2:  HYPERTENSION, BENIGN (ICD-401.1)  Borderline, needs f/u  Orders: Est. Patient Level IV (41324)  Problem # 3:  LIPOMA (ICD-214.9)  Discussed limited ddx to include abd wall hernia and concern with incarceration/ obstructions symptoms lacking at present.  Problem # 4:  RHINITIS (ICD-472.0)  Each maintenance medication was reviewed in detail including most importantly the difference between maintenance and as needed and under what circumstances the prns are to be used. See instructions for specific recommendations   Medications Added to Medication List This Visit: 1)  Nasonex 50 Mcg/act Susp (Mometasone furoate) .... Two puffs each nostril twice daily as needed 2)  B Complex-b12 Tabs (B complex vitamins) .Marland Kitchen.. 1 once daily  Patient Instructions: 1)  avoid decongestants like sudafed (ephedrine containing or phenylephrin containing otc's) 2)  avoid salt 3)  Return to  office in 3 months for CPX

## 2010-03-16 NOTE — Miscellaneous (Signed)
Summary: previsit  Clinical Lists Changes  Medications: Added new medication of MOVIPREP 100 GM  SOLR (PEG-KCL-NACL-NASULF-NA ASC-C) As directed. - Signed Rx of MOVIPREP 100 GM  SOLR (PEG-KCL-NACL-NASULF-NA ASC-C) As directed.;  #1 x 0;  Signed;  Entered by: Clide Cliff RN;  Authorized by: Meryl Dare MD [facility];  Method used: Electronically to [facility]*, [address] [place], [place]  [address], Ph: [phone] or [phone], Fax: [phone] Observations: Added new observation of ALLERGY REV: Done (06/25/2009 16:23)    Prescriptions: MOVIPREP 100 GM  SOLR (PEG-KCL-NACL-NASULF-NA ASC-C) As directed.  #1 x 0   Entered by:   Clide Cliff RN   Authorized by:   Meryl Dare MD [facility]   Signed by:   Clide Cliff RN on 06/25/2009   Method used:   Electronically to        [facility]* (retail)       [address] [place], [place]  [address]       Ph: [phone] or [phone]       Fax: [phone]   RxID:   [id number]

## 2010-06-02 ENCOUNTER — Other Ambulatory Visit: Payer: Self-pay | Admitting: Internal Medicine

## 2010-06-02 ENCOUNTER — Telehealth: Payer: Self-pay | Admitting: Internal Medicine

## 2010-06-02 MED ORDER — SERTRALINE HCL 100 MG PO TABS: 100.0000 mg | ORAL_TABLET | Freq: Every day | ORAL | Status: DC

## 2010-06-02 NOTE — Telephone Encounter (Signed)
rx was already sent today x 3 refills.

## 2010-06-29 NOTE — Assessment & Plan Note (Signed)
[facility]                             PULMONARY OFFICE NOTE   NAME:Scaglione, Sreya L                           MRN:          [id number]  DATE:09/01/2006                            DOB:          [birth year]    HISTORY:  A 60 year old white female with moderate obesity complicate by  hiatal hernia with documented GERD with intermittent cough that she says  feels like allergies draining down into the back of her throat, this  occurs more in the daytime than at night.  She has not year tried  Claritin as recommended p.r.n. itching and sneezing but denies this is a  major issue.  She says she is following a diet.  She previously was seen  for dyspnea going up hills but with regular exercise this is  improving.   For full info on medications, please see face sheet comment dated September 01, 2006.   PHYSICAL EXAMINATION:  She is an obese white female who has not lost any  weight, still at 209.  Blood pressure is 132/86.  HEENT:  Remarkable for minimal turbinate edema, nonspecific.  Oropharynx  reveals no illness whatsoever without any excess postnasal drainage or  cobblestoning.  NECK:  Supple without cervical adenopathy, tenderness, trachea is  midline, no thyromegaly.  LUNGS:  Fields perfectly clear bilaterally to auscultation and  percussion.  Has a regular rhythm without murmur, gallop or rub.  ABDOMEN:  Soft, benign.  EXTREMITIES:  Warm without calf tenderness, cyanosis, clubbing, or  edema.   IMPRESSION:  1. Chronic/recurrent cough, most likely is reflux.  I see no evidence      of excess mucus production and the cough is dry in nature.  I      reviewed with her again a gastroesophageal reflux disease diet and      if the cough becomes problematic she should increase the Protonix      to at least b.i.d. dosing for 2 weeks and then return here if this      is not working.  In addition, when she feels she has allergies      she can certainly use Claritin  over the counter as previous      recommendations.  2. Obesity complicate by mild hyperlipidemia.  I have reviewed with      her calorie balance issues again with her today.  I am assured that      she is improving in terms of exercise tolerance with regular      exercise so I do not believe dyspnea needs further workup at this      point.  3. General health maintenance.  Bone density was reviewed.  The      patient is quite favorable by the study dated Jun 30, 2006 with no      significant osteopenia, as would be expected in a patient who is      moderately overweight.  This can be followed up no sooner than 25      months from now but probably even beyond that because it  looks so      favorable.  I have reviewed with her optimal weightbearing      exercise, calcium and vitamin D recommendations.     Charlaine Dalton. Sherene Sires, MD, [facility]  Electronically Signed    MBW/MedQ  DD: 09/01/2006  DT: 09/01/2006  Job #: [id number]

## 2010-06-29 NOTE — Assessment & Plan Note (Signed)
[facility]                             PULMONARY OFFICE NOTE   NAME:[name], [name]                           MRN:          [id number]  DATE:01/01/2007                            DOB:          11-03-1950    HISTORY OF PRESENT ILLNESS:  The patient is a 60 year old white female  patient of Dr. Sherene Sires, who has a known history of rhinitis, cyclical cough  and hyperlipidemia.  Presented today for an acute office visit.  The  patient complains of a one-week history of nasal congestion, hoarseness,  post-nasal drip and cough.  The patient denies any hemoptysis,  orthopnea, PND or leg swelling.   PAST MEDICAL HISTORY:  Reviewed from last cpx.   PHYSICAL EXAMINATION:  The patient is a pleasant female in no acute  distress.  She is afebrile with stable vital signs.  Oxygen saturation  is 97% on room air.  HEENT:  Nasal mucosa is erythematous.  Maxillary sinus tenderness.  Posterior pharynx is clear.  TMs with some mild redness; no swelling  noted.  NECK:  Supple without cervical adenopathy.  Negative nuchal rigidity.  LUNGS:  Lung sounds are clear.  CARDIAC:  Regular rate.  ABDOMEN:  Soft and nontender.  EXTREMITIES:  Warm without any calf tenderness, clubbing, cyanosis or  edema.   IMPRESSION AND PLAN:  Acute rhinosinusitis.  The patient is to begin  Omnicef x10 days.  Mucinex twice a day.  Nasal hygiene regimen with  saline Afrin nasal spray.  The patient is to return back with Dr. Sherene Sires  as scheduled, or sooner if needed.      Rubye Oaks, NP  Electronically Signed      Charlaine Dalton. Sherene Sires, MD, [facility]  Electronically Signed   TP/MedQ  DD: 01/01/2007  DT: 01/02/2007  Job #: [id number]

## 2010-06-29 NOTE — Op Note (Signed)
NAME:  Savannah [name], Savannah [name]                    ACCOUNT NO.:  [id number]   MEDICAL RECORD NO.:  [id number]          PATIENT TYPE:  AMB   LOCATION:  [facility]                         FACILITY:  [facility]   PHYSICIAN:  Deidre Ala, M.D.    DATE OF BIRTH:  02-16-1950   DATE OF PROCEDURE:  05/10/2007  DATE OF DISCHARGE:                               OPERATIVE REPORT   PREOPERATIVE DIAGNOSIS:  Left carpal tunnel syndrome.   POSTOPERATIVE DIAGNOSIS:  Left carpal tunnel syndrome.   PROCEDURE:  Left carpal tunnel release.   SURGEON:  Doristine Section, M.D.   ASSISTANT:  Phineas Semen, P.A.-C.   ANESTHESIA:  General with LMA.   CULTURES:  None.   DRAINS:  None.   ESTIMATED BLOOD LOSS:  Minimal.   TOURNIQUET TIME:  22 minutes.   PATHOLOGIC FINDINGS AND HISTORY:  Shelvia presented with bilateral wrist  pain, median distribution, worse at night, 3 or 4 weeks onset, starting  on January 29, 2007.  We injected both wrists tenosynovium upon the  flexor side with Depo-Medrol and Xylocaine, put her in splints.  She did  not get good long-term relief and so was sent for nerve conductions and  EMGs which showed moderate left median mononeuropathy and mild right.  She was significantly symptomatic and desired release.  At surgery,  classic findings were noted.  Good release was obtained.   PROCEDURE:  With adequate anesthesia obtained using LMA technique, 1 g  Ancef given IV prophylaxis, the patient was placed in the supine  position.  The left upper extremity was prepped from the fingertips to  the upper forearm in standard fashion.  After standard prepping and  draping, Esmarch examination was used.  The tourniquet was let up to 250  mmHg.  Incision was then made at the base of the palm in the thumb  flexion crease longitudinally back to the distal wrist flexion crease in  the midline.  Incision was deepened sharply with adequate hemostasis  obtained using the Bovie electrocoagulator.  Under loupe  magnification,  dissection was carried down to the palmar fascia and transverse carpal  ligament.  A Freer elevator was placed underneath the ligament  retrograde to protect the nerve.  I then cut down upon with a 64 Beaver  blade, thus releasing it.  I then used scissors to neurolyse the nerve  and release the remainder of the transverse carpal ligament.  I released  the distal wrist flexor retinaculum on the ulnar side of the nerve well  up into the forearm underneath skin.  All branches were traced distally  including the motor branch.  Epineurium was removed from the volar  nerve.  Irrigation was carried out.  Digital palpation was performed up  the canal to make sure there were no other lesions.  Again, irrigation  was performed.  The wound was closed with running and interrupted 4-0  nylon.  A bulky sterile compressive dressing was applied with volar  plaster  splint in slight cock-up.  The patient tolerated the procedure well and  was awakened, taken to the recovery  room in satisfactory condition to be  discharged per outpatient routine, given Vicodin for pain, and told call  the office for an appointment for recheck on Friday.           ______________________________  V. Charlesetta Shanks, M.D.     VEP/MEDQ  D:  05/10/2007  T:  05/10/2007  Job:  [id number]   cc:   Deidre Ala, M.D.  Fax: [phone]

## 2010-06-29 NOTE — Op Note (Signed)
NAME:  Savannah, [name]                    ACCOUNT NO.:  [id number]   MEDICAL RECORD NO.:  [id number]          PATIENT TYPE:  AMB   LOCATION:  [facility]                         FACILITY:  [facility]   PHYSICIAN:  Deidre Ala, M.D.    DATE OF BIRTH:  [birth year]   DATE OF PROCEDURE:  DATE OF DISCHARGE:                               OPERATIVE REPORT   PREOPERATIVE DIAGNOSIS:  Right carpal tunnel syndrome.   POSTOPERATIVE DIAGNOSIS:  Right carpal tunnel syndrome.   PROCEDURE:  Right carpal tunnel release.   SURGEON:  1. Charlesetta Shanks, M.D.   ASSISTANT:  Phineas Semen, P.A.   ANESTHESIA:  General with LMA.   CULTURES:  None.   DRAINS:  None.   ESTIMATED BLOOD LOSS:  Minimal.   TOURNIQUET TIME:  19 minutes.   PATHOLOGIC FINDINGS AND HISTORY:  [name] presented with bilateral numbness  and tingling in the hands.  She had nerve conductions, EMGs which showed  moderate left carpal tunnel syndrome and mild right.  She had carpal  tunnel release in late March on the left and has done well since.  The  right hand is still bothering her so she decided to proceed with  release.  At surgery, classic findings were noted with a tight  transverse carpal ligament.  No mass lesions in the carpal canal.  A  good release was obtained including all branches including the motor  branch.   PROCEDURE:  With adequate anesthesia obtained using LMA technique, 1 g  Ancef given IV prophylaxis, the patient was placed in the supine  position.  The right upper extremity was prepped from the fingertips to  the upper forearm in the standard fashion.  After standard prepping and  draping, Esmarch exsanguination was used.  The tourniquet let up to 250  mmHg.  A longitudinal incision was then made at the base of the palm in  [place] skin lines at the thumb flexion crease to the distal palmar  flexion crease.  Incision was deepened sharply knife and hemostasis  obtained using the Bovie electrocoagulator.  Under loupe  magnification  dissection was carried down to the palmar fascia, which was incised  longitudinally exposing the transverse carpal ligament.  I then placed a  Freer elevator underneath the ligament to protect the nerve and cut down  upon it with a 64 Beaver blade.  This exposed the median nerve, which  was carefully neurolysed with scissors distally and proximally up on the  ulnar side of the wrist, releasing the wrist flexor retinaculum.  The  volar epineurium was then released to relieve the hour-glassing of the  nerve.  Irrigation was carried out.  Digital palpation of the canal was  performed and no mass lesions noted.  All branches were traced distally  including the motor branch.  The wound was then closed in a single layer  on the skin with running and interrupted 4-0 nylon.  We then placed 0.5%  Marcaine 7 mL in and about the wound.  A bulky sterile compressive  dressing was applied with a volar plaster splint with  slight cock-up and  an Ace.  The patient then, having tolerated the procedure well, was  awakened and taken to the recovery room in satisfactory condition to be  discharged per outpatient routine, given Vicodin for pain and told call  the office for an appointment for recheck on Monday.           ______________________________  V. Charlesetta Shanks, M.D.     VEP/MEDQ  D:  06/07/2007  T:  06/07/2007  Job:  [id number]   cc:   Charlaine Dalton. Sherene Sires, MD, FCCP  [address]  [place] [address]

## 2010-07-02 NOTE — Assessment & Plan Note (Signed)
HEALTHCARE                             PULMONARY OFFICE NOTE   NAME:Savannah [name], Savannah [name]                           MRN:          [id number]  DATE:04/03/2006                            DOB:          [birth year]    HISTORY:  A 60 year old white female with a history of tension headaches  who comes in now with increasing sinus pain over the last several  months, typically worse as the day goes on, associated with neck  tension as well and of minimal nasal congestion.  She admits she uses a  lot of caffeine during the day.  She denies any significant purulent  sputum, fever, toothache, or neurologic complaints.   For full list of medication, please seen face sheet dated April 03, 2006.   The patient has used Advil Cold and Sinus and does respond that she is  some better but not completely and think she may do just  as well with  Advil as she does with Advil Cold and Sinus in terms of response to  therapy.   PHYSICAL EXAMINATION:  She is an obese, somewhat depressed appearing  ambulatory white female in no acute distress.  She has stable vital signs.  HEENT:  Reveals mild tenderness over her right greater than right  temple, no tenderness over the sinuses.  NECK:  Also reveals minimal tenderness in the cervical muscles.  However, nasal turbinates reveal mild nonspecific turbinate edema.  Oropharynx is clear with no evidence of postnasal drainage,  cobblestoning.  NECK:  Supple without cervical adenopathy or tenderness.  Trachea is  midline.  LUNGS:  Lung fields are clear bilaterally to auscultation and  percussion.  There is a regular rate and rhythm without murmur, gallop, rub.  ABDOMEN:  Is soft, benign.  EXTREMITIES:  Warm without calf tenderness, cyanosis, clubbing or edema.   IMPRESSION:  1. 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  2. Her sinus headaches are actually typical of tension in that they      are associated with  neck muscle tenderness, worse as the day goes      on and not associated with any purulent secretions or other typical      sinus features.  I asked her to avoid caffeine completely and      recommended Midrin 2 at onset and 1 every 4 hours with a follow up      sinus and head CT scan if not impressed with the response to      Midrin.  If CT scans are negative and Midrin is not effective, the next step  would be to send her to [facility].   I spent an extra 15 to 20 minutes of a 25 minute visit going over these  issues with the patient in detail, doing headache education in general,  trying to help her understand the difference between a sinus ostial  obstruction related headache and tension headache.  I do think she has  nonspecific rhinitis that might benefit from nasal steroids but I do not  believe  that the minimal nonspecific nasal obstructive symptoms she has,  has anything to do with her headache and for  now we will try to avoid muddying the water by treating her with more  than 1 problem at a time.  I have scheduled her for comprehensive health  care evaluation April 2008.  We will see her sooner if needed.     Charlaine Dalton. Sherene Sires, MD, [facility]  Electronically Signed    MBW/MedQ  DD: 04/04/2006  DT: 04/04/2006  Job #: [id number]

## 2010-07-02 NOTE — Op Note (Signed)
Savannah [name], Savannah [name]                         ACCOUNT NO.:  [id number]   MEDICAL RECORD NO.:  [id number]                   PATIENT TYPE:  OIB   LOCATION:  2887                                 FACILITY:  [facility]   PHYSICIAN:  Angelia Mould. Derrell Lolling, M.D.             DATE OF BIRTH:  01-17-1951   DATE OF PROCEDURE:  04/03/2002  DATE OF DISCHARGE:                                 OPERATIVE REPORT   PREOPERATIVE DIAGNOSIS:  Chronic cholecystitis with cholelithiasis.   POSTOPERATIVE DIAGNOSIS:  Chronic cholecystitis with cholelithiasis.   PROCEDURE:  Laparoscopic cholecystectomy with intraoperative cholangiogram.   SURGEON:  Angelia Mould. Derrell Lolling, M.D.   ASSISTANT:  Donnie Coffin. Samuella Cota, M.D.   OPERATIVE INDICATION:  This is a 60 year old white female who has a one-week  history of attacks of epigastric pain and right upper quadrant pain  radiating to the back with associated nausea and vomiting.  The gallbladder  showed gallstones filling the gallbladder with slight gallbladder wall  thickening but a normal common bile duct.  Liver function tests one week ago  were normal but on 04/02/02 the alkaline phosphatase, AST, and ALT were  elevated but the bilirubin was normal.  She is brought to the operating room  semi-electively.   OPERATIVE FINDINGS:  The gallbladder was chronically inflamed, slightly  thick-walled, with moderate adhesions to it.  The anatomy of the cystic  duct, cystic artery, and common bile duct were conventional.  The  cholangiogram showed no filling defect, prompt flow of contrast into the  duodenum, and normal intrahepatic and extrahepatic bile duct anatomy.  The  liver appeared healthy.  The stomach and duodenum, large intestine and small  intestine, and peritoneal surfaces were grossly normal to inspection.   DESCRIPTION OF PROCEDURE:  Following the induction of general endotracheal  anesthesia, the patient's abdomen was prepped and draped in a sterile  fashion.  Marcaine  0.5% with epinephrine was used as a local infiltration  anesthetic.  A vertically-oriented incision was made through the lower rim  of the umbilicus.  There appeared to be an old scar there.  The fascia was  incised in the midline and the abdominal cavity entered under direct vision.  A 10 mm Hasson trocar was inserted and secured with a pursestring suture of  0 Vicryl.  Pneumoperitoneum was created.  The video camera was inserted with  visualization and findings as described above.  A 10 mm trocar was placed in  the subxiphoid region and two 5 mm trocars placed in the right midabdomen.  The gallbladder fundus was identified and elevated.  Adhesions were taken  down.  The gallbladder was retracted to the right.  We dissected out the  cystic duct and the cystic artery.  The cystic artery was isolated and  secured with metal clips and divided as it went on the gallbladder wall.  We  dissected out the cystic duct for a fairly significant length  and created a  nice window behind the cystic duct.  The cystic duct was then clipped with a  metal clip close to the gallbladder.  A cholangiogram catheter was inserted  into the cystic duct.  A cholangiogram was obtained using the C-arm.  The  cholangiogram showed normal intrahepatic and extrahepatic bile duct anatomy,  no filling defects, and prompt flow of contrast into the duodenum.  This was  also read by Audie Pinto, M.D., who said that it was normal.   The cholangiogram catheter.  The cystic duct was secured with metal clips  and divided.  The gallbladder was dissected from its bed with electrocautery  and removed through the umbilical port.  The operative field was copiously  irrigated with saline.  Hemostasis was excellent.  At the completion of the  case there was no bleeding and no bile leak whatsoever.  The trocars were  removed under direct vision, and there was no bleeding from the trocar  sites.  The pneumoperitoneum was released.  The  fascia at the umbilicus was  closed with the 0 Vicryl suture.  The skin incisions were closed with  subcuticular sutures of 4-0 Vicryl and Steri-Strips.  Clean bandages were  placed and the patient taken to the recovery room in stable condition.  Estimated blood loss was about 10 mL.  Complications:  None.  Sponge,  needle, and instrument counts were correct.                                               Angelia Mould. Derrell Lolling, M.D.    HMI/MEDQ  D:  04/03/2002  T:  04/03/2002  Job:  [id number]   cc:   Casimiro Needle B. Sherene Sires, M.D. [facility]   Malcolm T. Russella Dar, M.D. [facility]

## 2010-07-02 NOTE — Assessment & Plan Note (Signed)
[facility]                             PULMONARY OFFICE NOTE   NAME:[name], [name]                           MRN:          [id number]  DATE:05/18/2006                            DOB:          12-21-1950    HISTORY:  A 60 year old white female complaining of worsening dyspnea  over the last year, especially walking up hill without any change with  weather, environmental change and no significant variability that she  can identify.  She denies any associated chest pain, but she  intermittently does has a cough that is not directly related to this  complaint.  She denies any orthopnea, PND, TIA or claudication symptoms,  or any obvious alleviating factors.   PAST MEDICAL HISTORY:  1. Obesity with an ideal weight less than 171, target less than 190.  2. Nonspecific rhinitis.  3. Cyclical cough felt to be partially reflux in the past; see      endoscopy January 2001 consistent with a small hiatal hernia.  4. Hiatal hernia.  5. Irritable bowel syndrome.  6. Hyperlipidemia, target LDL less than 160 based on the absence of      identifiable risk factors.  7. Recent headaches (see most recent office visit April 03, 2006),      completely resolved with Midrin and does not require daily dosing.   ALLERGIES:  PROZAC CAUSES WELTS, POSSIBLE PPI RASH. (Note, however, she  has been able to tolerate Prilosec and thinks she can take Protonix as  well.)   MEDICATIONS:  Reviewed in detail on the work sheet, correct as listed  and the column dated May 18, 2006.   FAMILY HISTORY:  Negative for breast or colon cancer, premature heart  disease.  Father had prostate cancer, mother had thyroid problems.   SOCIAL HISTORY:  She does clerical work.  She has never smoked.  She  denies any excess alcohol use.   REVIEW OF SYSTEMS:  Taken in detail on the work sheet, significant for  the problems as outlined above.  She does have occasional nocturia.   PHYSICAL  EXAMINATION:  This is mildly obese ambulatory white female in  no acute distress who clears her throat frequently during interview and  exam.  HEENT:  Reveals mild to moderate nonspecific turbinate edema, oropharynx  is clear.  Ear canals clear bilaterally, limited ocular exam was benign  by funduscope.  NECK:  Supple without cervical adenopathy or tenderness.  Carotid  upstrokes were brisk without any bruits.  Trachea was midline with no  thyromegaly.  Lung fields perfectly clear bilaterally to auscultation and percussion.  Regular rhythm without murmur, gallop, or rub present.  ABDOMEN:  Soft, benign without palpable organomegaly, mass or  tenderness.  EXTREMITIES:  Warm without calf tenderness, cyanosis, clubbing or edema,  pedal pulses were present bilaterally and symmetric.  BREAST:  Without masses, nipple or skin changes, no axillary nodes.  NEUROLOGIC:  Completely normal.   CBC was normal with no significant eosinophilia.  Chemistry profile was  normal.  Serum cholesterol was 246 with a total cholesterol of 165 and  LDL of 165, HDL of 46 and TSH was normal.  Chest x-ray was normal.   IMPRESSION:  1. Dyspnea on exertion of this type pattern is most likely related to      obesity deconditioning.  I have advised the patient on a weight      loss made simple sheet to help her understand a better concept of      calorie balance and will aim for a target weight of 190 with an      ideal weight of 171 based on her height.  2. Frequent throat clearing, suggests reflux.  I have advised her to      switch back over to Protonix 40 mg q.a.m. and I reviewed diet with      her.  3. Intermittent rhinitis symptoms.  I recommended a trial of Claritin      p.r.n. and we will consider adding back nasal steroids if this does      not eliminate the problem.  4. Hyperlipidemia.  This should correct to target once she is      consistently exercising.  5. Chronic low back pain, better under Dr.  Ollen Gross direction.  6. Tendency to recurrent headaches.  Note, her most recent headaches      totally resolved with Midrin, consistent with a tension mechanism.  7. GYN health maintenance per Dr. Ambrose Mantle.  Bone densitometry is due to      be repeated this year and was scheduled.  Colonoscopy is in the      computer for 2011.  She was given tetanus in 2005 and Pneumovax      2007.   Follow up will be every 3 months, sooner if needed.     Charlaine Dalton. Sherene Sires, MD, [facility]  Electronically Signed    MBW/MedQ  DD: 05/19/2006  DT: 05/19/2006  Job #: [id number]

## 2010-07-02 NOTE — Assessment & Plan Note (Signed)
[facility]                             PULMONARY OFFICE NOTE   NAME:[name], [name]                           MRN:          [id number]  DATE:03/20/2006                            DOB:          Aug 03, 1950    HISTORY OF PRESENT ILLNESS:  Patient is a 60 year old white female  patient of Dr. Thurston [name], who has a known history of rhinitis, cough, and  anemia.  Presents for an acute office visit.  Patient complains that  over the last two days, she has had left neck, shoulder, and back pain.  The patient complained of pain along the upper left shoulder down into  the chest wall, rib cage, and that has worsened when she lifts her left  arm.  The pain is constant with increased symptoms with movement of her  arm and turning.  The patient also complains that she has had reflux  over the last several weeks.  She is on Pepcid twice daily, but it is  not helping.  She denies any shortness of breath, nausea, vomiting,  palpitations, abdominal pain, recent travel, or antibiotic use.  The  patient has not used any medications for treatment.   PAST MEDICAL HISTORY:  Reviewed.   CURRENT MEDICATIONS:  Reviewed.   FAMILY HISTORY:  Positive for coronary artery disease in a grandmother  and aunt.   PHYSICAL EXAMINATION:  GENERAL:  Patient is a pleasant female in no  acute distress.  VITAL SIGNS:  She is afebrile with stable vital signs.  Saturation is  98% on room air.  HEENT:  Nasal mucosa is slightly pale.  Nontender sinuses.  Posterior  pharynx is clear.  NECK:  Supple without adenopathy.  No JVD.  LUNGS:  Lung sounds are clear to auscultation bilaterally.  CARDIAC:  Regular rate and rhythm without murmur, rub or gallop.  ABDOMEN:  Soft and nontender with no hepatosplenomegaly.  No guarding or  rebound noted.  No abdominal bruits ore masses appreciated.  EXTREMITIES:  Warm without any clubbing, cyanosis or edema.  Patient has  tenderness along the left shoulder and  subscapular region.  Cervical  range of motion without radicular symptoms.  Pulses are intact, equal  strength in the upper and lower extremities.  Left arm adduction with  reproducible pain.  Anterior chest wall is tender to palpation.  No  ecchymosis or deformity is noted.  Patient has tenderness along the  lateral cervical neck, left shoulder, and left subscapular region.  SKIN:  Warm without any rash.   DATA:  EKG reveals a normal sinus rhythm with nonspecific changes and  without change from comparison of her February, 2006 EKG.   IMPRESSION/PLAN:  1. Left shoulder and back pain secondary to probable strain.  The      patient is to begin Motrin 800 mg twice daily with food for the      next 5-7 days.  Skelaxin 800 mg up to 3 times a day as needed.      Vicodin #20 1 every 4-6 hours as needed.  Patient is to alternate  ice and heat to the area.  Patient is to return back in two weeks      with Dr. [name] or sooner if needed.  Patient does have some chest      wall tenderness.  Suspect it is musculoskeletal in nature.  The      patient does have a family history of coronary artery disease.      Patient has been advised on cardiac symptoms and is to report if      she develops these or go to the closest emergency department.  2. Gastroesophageal reflux:  Patient is to begin Protonix 40 mg daily      along with antireflux preventative measures.  Patient is to check      back with Dr. [name] in two weeks or sooner if needed.      Savannah Oaks, NP  Electronically Signed      Savannah [name]. Savannah Sires, MD, [facility]  Electronically Signed   TP/MedQ  DD: 03/20/2006  DT: 03/20/2006  Job #: [id number]

## 2010-09-14 ENCOUNTER — Encounter: Payer: Self-pay | Admitting: Internal Medicine

## 2010-09-14 DIAGNOSIS — F329 Major depressive disorder, single episode, unspecified: Secondary | ICD-10-CM | POA: Insufficient documentation

## 2010-09-22 ENCOUNTER — Telehealth: Payer: Self-pay | Admitting: Internal Medicine

## 2010-09-22 MED ORDER — PANTOPRAZOLE SODIUM 40 MG PO TBEC: DELAYED_RELEASE_TABLET | ORAL | Status: DC

## 2010-09-22 NOTE — Telephone Encounter (Signed)
Spoke with pt and is aware rx was sent to pharmacy to [facility]

## 2010-10-14 ENCOUNTER — Telehealth: Payer: Self-pay | Admitting: *Deleted

## 2010-10-14 NOTE — Telephone Encounter (Signed)
Received PA fax from [facility]. Called the number listed above. Pt id # [id number]. Med approved x 1 year. [facility] advised. Carron Curie, CMA

## 2010-10-28 ENCOUNTER — Encounter: Payer: Self-pay | Admitting: Internal Medicine

## 2010-11-08 LAB — POCT HEMOGLOBIN-HEMACUE: Operator id: 133231

## 2010-11-09 LAB — POCT HEMOGLOBIN-HEMACUE
Hemoglobin: 15.8 — ABNORMAL HIGH
Operator id: 114531

## 2010-11-11 ENCOUNTER — Telehealth: Payer: Self-pay | Admitting: Internal Medicine

## 2010-11-11 NOTE — Telephone Encounter (Signed)
Received letter from [facility] stating that the pt is not taking her sertaline. Per MW she needs ov here in at least 6 wks with all meds in hand. Spoke with pt and sched her for cpx for 12/20/10 at 9 am. Advised to bring all meds and come in fasting since she is due for cpx. Pt verbalized understanding.

## 2010-12-06 ENCOUNTER — Telehealth: Payer: Self-pay | Admitting: Internal Medicine

## 2010-12-06 NOTE — Telephone Encounter (Signed)
Called, spoke with pt.  States she fell out of the shower last Thursday and hit her left shoulder and side of her head.  Pt states she did have a knot on her head, bruises, and stratches but now concerned because her left shoulder is still sore.  She has also noticed a knot near her left armpit and requesting recs on what to do about this.  OV scheduled for tomorrow at 3:45 pm with TP -- pt aware (per pt, she cannot come in for OV today because she already has another appt).   Pt was instructed to seek emergency care at [facility] or ED if symptoms get worse or she feels she needs to be evaluated prior to OV.  She verbalized understanding of these instructions.  Note:  Pt was last seen by MW on 08/2009.  She does have a pending CPX scheduled with him in Nov and was instructed she will still need to keep this appt.  She verbalized understanding of this.

## 2010-12-07 ENCOUNTER — Encounter: Payer: Self-pay | Admitting: Adult Health

## 2010-12-07 ENCOUNTER — Ambulatory Visit (INDEPENDENT_AMBULATORY_CARE_PROVIDER_SITE_OTHER): Payer: Managed Care, Other (non HMO) | Admitting: Adult Health

## 2010-12-07 ENCOUNTER — Ambulatory Visit (INDEPENDENT_AMBULATORY_CARE_PROVIDER_SITE_OTHER)
Admission: RE | Admit: 2010-12-07 | Discharge: 2010-12-07 | Disposition: A | Discharge status: Home / Self Care | Payer: Managed Care, Other (non HMO) | Source: Ambulatory Visit | Attending: Adult Health | Admitting: Adult Health

## 2010-12-07 VITALS — BP 130/72 | HR 63 | Temp 98.8°F | Ht 69.0 in | Wt 187.2 lb

## 2010-12-07 DIAGNOSIS — M2550 Pain in unspecified joint: Secondary | ICD-10-CM

## 2010-12-07 DIAGNOSIS — W19XXXA Unspecified fall, initial encounter: Secondary | ICD-10-CM

## 2010-12-07 DIAGNOSIS — M79609 Pain in unspecified limb: Secondary | ICD-10-CM

## 2010-12-07 MED ORDER — METAXALONE 800 MG PO TABS: 800.0000 mg | ORAL_TABLET | Freq: Three times a day (TID) | ORAL | Status: AC | PRN

## 2010-12-07 NOTE — Patient Instructions (Signed)
Alternate ice and heat to shoulder As needed   I will call with xray results.  Motrin As needed  For shoulder pain.  Skelaxin 800mg  Three times a day  As needed  Muscle spasm.  Please contact office for sooner follow up if symptoms do not improve or worsen or seek emergency care  If not improving call back and can refer to orthopedics.

## 2010-12-09 NOTE — Progress Notes (Signed)
Subjective:    Patient ID: Savannah [name], female    DOB: 1951/01/19, 60 y.o.   MRN: [id number]  HPI 59  yowf never smoker with moderated obesity and tendency to cyclical coughing and both chronic and seasonal rhinitis.   December 25, 2007 CPX: co worsening nasal congestion with no response to clariton, afrin. Does not remember previous disucussion of nasal steroids or flyer reviewed in 2006. No ha or purulent secretions, sob, cp or sign increase cough, fever, ear problems. rec optimal topical rx with nasal steroids.   May 19, 2008 ov 4/1 cleaned back yard, then 4/3 broke out in itching rash mostly right arm and around right eyelid. No sob, cough. rx as contact dermatitis and resolved.   May 20, 2009 Yearly followup. Pt c/o sneezing and runny nose x several days. She c/o "knot" on her right side she noticed a few months ago. imp ? hernia s/p remote lap chole and elevated so watch salt   August 24, 2009 cpx no new c/os - rlight uq tingling twice monthly lasts a few hours, no pattern.   12/07/10 Acute OV  Pt complains of left shoulder pain.  Patient states she fell recently and hit the floor after slipping while getting out of the shower x 1 and 1/2 weeks ago, now has a knot near her left armpit. Was getting out of shower and lost her step fall back out into floor. Hit on left side against cabinet and floor/wall 2 weeks ago. Has been very sore and tender along left side of neck, shoulder, and under left arm. Able to lift arm but painful. Feels puffy under left armpit. No LOC, no known head injury. No visual/speech changes. OTC some help.     Past Medical History:  1. Obesity with an ideal weight less than 171, target less than 190 to get BMI < 30  2. Nonspecific rhinitis  - CRF given 04/06/04  - CRF repeated December 25, 2007  3. Cyclical cough felt to be partially reflux in the past; see  endoscopy January 2001 consistent with a small hiatal hernia.  4aHiatal hernia. Pos egd  1/01................................................................Marland KitchenStark  4b Diverticulosis pos colonoscopy 02/1999............................................Marland KitchenStark  5. Irritable bowel syndrome.  6. Hyperlipidemia, target LDL less than 160 based on the absence of  identifiable risk factors.  7. Recent headaches (see most recent office visit April 03, 2006),  completely resolved with Midrin and does not require daily dosing.  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  HEENT:   No headaches,  Difficulty swallowing,  Tooth/dental problems, or  Sore throat,                No sneezing, itching, ear ache, nasal congestion, post nasal drip,   CV:  No chest pain,  Orthopnea, PND, swelling in lower extremities, anasarca, dizziness, palpitations, syncope.   GI  No heartburn, indigestion, abdominal pain, nausea, vomiting, diarrhea, change in bowel habits, loss of  appetite, bloody stools.   Resp: No shortness of breath with exertion or at rest.  No excess mucus, no productive cough,  No non-productive cough,  No coughing up of blood.  No change in color of mucus.  No wheezing.  No chest wall deformity  Skin: no rash or lesions.  GU: no dysuria, change in color of urine, no urgency or frequency.  No flank pain, no hematuria   MS:  + joint pain    + decreased range of motion.     Psych:  No change in mood or affect. No depression or anxiety.  No memory  loss.         Objective:   Physical Exam GEN: A/Ox3; pleasant , NAD, well nourished   HEENT:  [place]/AT,  EACs-clear, TMs-wnl, NOSE-clear, THROAT-clear, no lesions, no postnasal drip or exudate noted.   NECK:  Supple w/ fair ROM; no JVD; normal carotid impulses w/o bruits; no thyromegaly or nodules palpated; no lymphadenopathy.  RESP  Clear  P & A; w/o, wheezes/ rales/ or rhonchi.no accessory muscle use, no dullness to percussion  CARD:  RRR, no m/r/g  , no peripheral edema, pulses intact, no cyanosis or clubbing.  GI:   Soft & nt; nml bowel sounds; no organomegaly or masses detected.  Musco: Warm bil, no deformities or joint swelling noted.  Tender along left upper shoulder and under left arm pit. No obvious deformity noted. nml ROM, reproducible pain w/ abduction of left arm. Cervical ROM nml. nml grips.   Neuro: alert, no focal deficits noted.    Skin: Warm, no lesions or rashes         Assessment & Plan:

## 2010-12-09 NOTE — Assessment & Plan Note (Signed)
Left shoulder pain s/p fall  Will check xray to r/o fx   Plan ;  Alternate ice and heat to shoulder As needed   I will call with xray results.  Motrin As needed  For shoulder pain.  Skelaxin 800mg  Three times a day  As needed  Muscle spasm.  Please contact office for sooner follow up if symptoms do not improve or worsen or seek emergency care  If not improving call back and can refer to orthopedics.

## 2010-12-17 ENCOUNTER — Encounter: Payer: Self-pay | Admitting: Internal Medicine

## 2010-12-20 ENCOUNTER — Other Ambulatory Visit (INDEPENDENT_AMBULATORY_CARE_PROVIDER_SITE_OTHER): Payer: Managed Care, Other (non HMO)

## 2010-12-20 ENCOUNTER — Ambulatory Visit (INDEPENDENT_AMBULATORY_CARE_PROVIDER_SITE_OTHER): Payer: Managed Care, Other (non HMO) | Admitting: Internal Medicine

## 2010-12-20 ENCOUNTER — Encounter: Payer: Self-pay | Admitting: Internal Medicine

## 2010-12-20 ENCOUNTER — Other Ambulatory Visit: Payer: Self-pay | Admitting: Internal Medicine

## 2010-12-20 DIAGNOSIS — J310 Chronic rhinitis: Secondary | ICD-10-CM

## 2010-12-20 DIAGNOSIS — M2550 Pain in unspecified joint: Secondary | ICD-10-CM

## 2010-12-20 DIAGNOSIS — E785 Hyperlipidemia, unspecified: Secondary | ICD-10-CM

## 2010-12-20 DIAGNOSIS — I10 Essential (primary) hypertension: Secondary | ICD-10-CM

## 2010-12-20 DIAGNOSIS — Z0000 Encounter for general adult medical examination without abnormal findings: Secondary | ICD-10-CM

## 2010-12-20 LAB — LIPID PANEL
Cholesterol: 204 mg/dL — ABNORMAL HIGH (ref 0–200)
HDL: 51.5 mg/dL (ref >39.00)
Triglycerides: 140 mg/dL (ref 0.0–149.0)

## 2010-12-20 LAB — BASIC METABOLIC PANEL
CO2: 29 mEq/L (ref 19–32)
Chloride: 107 mEq/L (ref 96–112)
Creatinine, Ser: 0.8 mg/dL (ref 0.4–1.2)
GFR: 81.85 mL/min (ref >60.00)
Potassium: 3.9 mEq/L (ref 3.5–5.1)

## 2010-12-20 LAB — URINALYSIS
Bilirubin Urine: NEGATIVE
Ketones, ur: NEGATIVE
Specific Gravity, Urine: 1.02 (ref 1.000–1.030)
Urine Glucose: NEGATIVE
pH: 6 (ref 5.0–8.0)

## 2010-12-20 LAB — CBC WITH DIFFERENTIAL/PLATELET
Eosinophils Relative: 1.3 % (ref 0.0–5.0)
Hemoglobin: 14.4 g/dL (ref 12.0–15.0)
Monocytes Relative: 7 % (ref 3.0–12.0)
Neutrophils Relative %: 62.7 % (ref 43.0–77.0)
Platelets: 248 10*3/uL (ref 150.0–400.0)

## 2010-12-20 LAB — HEPATIC FUNCTION PANEL
ALT: 19 U/L (ref 0–35)
Bilirubin, Direct: 0 mg/dL (ref 0.0–0.3)
Total Bilirubin: 0.5 mg/dL (ref 0.3–1.2)
Total Protein: 7.1 g/dL (ref 6.0–8.3)

## 2010-12-20 LAB — TSH: TSH: 2.14 u[IU]/mL (ref 0.35–5.50)

## 2010-12-20 LAB — LDL CHOLESTEROL, DIRECT: Direct LDL: 134 mg/dL

## 2010-12-20 NOTE — Patient Instructions (Signed)
Please remember to go to the lab   department downstairs for your tests - we will call you with the results when then are available.   Exercise should be 30 min 3 x weekly  Schedule mammograms by end of year and be sure to send [place] a copy  See Dr Renae Fickle if shoulder not better on up to  4 motrin with each meal (max of 12 per day)

## 2010-12-20 NOTE — Assessment & Plan Note (Signed)
Adequate control on present rx, reviewed  

## 2010-12-20 NOTE — Progress Notes (Signed)
Subjective:    Patient ID: Savannah [name], female    DOB: [birth year], 60 y.o.   MRN: [id number]  HPI  65 yowf never smoker with moderated obesity and tendency to cyclical coughing and both chronic and seasonal rhinitis.  December 25, 2007 CPX: co worsening nasal congestion with no response to clariton, afrin. Does not remember previous disucussion of nasal steroids or flyer reviewed in 2006. No ha or purulent secretions, sob, cp or sign increase cough, fever, ear problems. rec optimal topical rx with nasal steroids.  May 19, 2008 ov 4/1 cleaned back yard, then 4/3 broke out in itching rash mostly right arm and around right eyelid. No sob, cough. rx as contact dermatitis and resolved.  May 20, 2009 Yearly followup. Pt c/o sneezing and runny nose x several days. She c/o "knot" on her right side she noticed a few months ago. imp ? hernia s/p remote lap chole and elevated so watch salt  August 24, 2009 cpx no new c/os - rlight uq tingling twice monthly lasts a few hours, no pattern.  12/07/10 Acute OV  Pt complains of left shoulder pain. Patient states she fell recently and hit the floor after slipping while getting out of the shower x 1 and 1/2 weeks ago, now has a knot near her left armpit. Was getting out of shower and lost her step fall back out into floor. Hit on left side against cabinet and floor/wall 2 weeks ago. Has been very sore and tender along left side of neck, shoulder, and under left arm. Able to lift arm but painful. Feels puffy under left armpit. No LOC, no known head injury. No visual/speech changes. OTC some help. rec Xray > djd changes only nsaids   12/20/2010 Raul Torrance/ CPX  No new complaints, no ex but good activity tolerance s cp or sob  Past Medical History:   . Obesity with an ideal weight less than 171, target less than 190 to get BMI < 30    Nonspecific rhinitis  - CRF given 04/06/04  - CRF repeated December 25, 2007    Cyclical cough felt to be partially reflux in the past; see   endoscopy January 2001 consistent with a small hiatal hernia.    Hiatal hernia. Pos egd 1/01................................................................Marland KitchenStark      Diverticulosis pos colonoscopy 02/1999............................................Marland KitchenStark   Irritable bowel syndrome.    Hyperlipidemia, target LDL less than 160 based on the absence of  identifiable risk factors.  Headaches (see most recent office visit April 03, 2006),  completely resolved with Midrin and does not require daily dosing.  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  HENT: Positive for neck pain. Negative for hearing loss, ear pain, nosebleeds, congestion, sore throat, facial swelling, rhinorrhea, sneezing, mouth sores, trouble swallowing, neck stiffness, dental problem, voice change, postnasal drip, sinus pressure, tinnitus and ear discharge.   Eyes: Negative for photophobia,  discharge, itching and visual disturbance.  Respiratory: Positive for chest tightness. Negative for apnea, cough, choking, shortness of breath, wheezing and stridor.   Cardiovascular: Negative for chest pain, palpitations and leg swelling.  Gastrointestinal: Negative for nausea, vomiting, abdominal pain, constipation, blood in stool and abdominal distention.  Genitourinary: Negative for dysuria, urgency, frequency,  hematuria, flank pain, decreased urine volume and difficulty urinating.  Musculoskeletal: Positive for arthralgias. Negative for myalgias, back pain, joint swelling and gait problem.  Skin: Negative for color change, pallor and rash.  Neurological: Positive for headaches. Negative for dizziness, tremors, seizures, syncope, speech difficulty, weakness, light-headedness and numbness.  Hematological: Negative for adenopathy. Does not bruise/bleed easily.  Psychiatric/Behavioral: Negative for confusion, sleep disturbance and agitation. The patient is not nervous/anxious.        Objective:   Physical Exam   amb wf nad  Wt 182 12/20/2010   HEENT: nl dentition, turbinates, and orophanx. Nl external ear canals without cough reflex   NECK :  without JVD/Nodes/TM/ nl carotid upstrokes bilaterally   LUNGS: no acc muscle use, clear to A and P bilaterally without cough on insp or exp maneuvers   CV:  RRR  no s3 or murmur or increase in P2, no edema   ABD:  soft and nontender with nl excursion in the supine position. No bruits or organomegaly, bowel sounds nl  MS:  warm without deformities, calf tenderness, cyanosis or clubbing Mild pain on abduction L shoulder above 60 degrees   Breasts  No masses or nodes   SKIN: warm and dry without lesions    NEURO:  alert, approp, no deficits    Shoulder 12/07/10 No fracture or dislocation.  Moderate acromioclavicular joint degenerative changes      Assessment & Plan:

## 2010-12-20 NOTE — Assessment & Plan Note (Signed)
Reviewed xrays and approp doses for nsaids

## 2010-12-20 NOTE — Assessment & Plan Note (Signed)
Resolved with wt loss 

## 2010-12-21 ENCOUNTER — Other Ambulatory Visit: Payer: Self-pay | Admitting: Internal Medicine

## 2010-12-21 DIAGNOSIS — Z1231 Encounter for screening mammogram for malignant neoplasm of breast: Secondary | ICD-10-CM

## 2010-12-22 LAB — VITAMIN D 1,25 DIHYDROXY
Vitamin D2 1, 25 (OH)2: <8 pg/mL
Vitamin D3 1, 25 (OH)2: 59 pg/mL

## 2010-12-23 NOTE — Progress Notes (Signed)
Quick Note:  Spoke with pt and notified of results per Dr. Wert. Pt verbalized understanding and denied any questions.  ______ 

## 2010-12-27 ENCOUNTER — Telehealth: Payer: Self-pay | Admitting: Internal Medicine

## 2010-12-27 MED ORDER — SERTRALINE HCL 100 MG PO TABS: 100.0000 mg | ORAL_TABLET | Freq: Every day | ORAL | Status: DC

## 2010-12-27 NOTE — Telephone Encounter (Signed)
I spoke with pt and she states she needed a refill on her zoloft. I advised pt will send rx for her. Nothing further was needed and rx has been sent

## 2011-01-20 ENCOUNTER — Ambulatory Visit (HOSPITAL_COMMUNITY)
Admission: RE | Admit: 2011-01-20 | Discharge: 2011-01-20 | Disposition: A | Discharge status: Home / Self Care | Payer: Managed Care, Other (non HMO) | Source: Ambulatory Visit | Attending: Internal Medicine | Admitting: Internal Medicine

## 2011-01-20 DIAGNOSIS — Z1231 Encounter for screening mammogram for malignant neoplasm of breast: Secondary | ICD-10-CM | POA: Insufficient documentation

## 2011-02-03 ENCOUNTER — Other Ambulatory Visit: Payer: Self-pay | Admitting: *Deleted

## 2011-02-03 MED ORDER — PANTOPRAZOLE SODIUM 40 MG PO TBEC: DELAYED_RELEASE_TABLET | ORAL | Status: DC

## 2011-02-07 ENCOUNTER — Other Ambulatory Visit: Payer: Self-pay | Admitting: Allergy

## 2011-02-28 ENCOUNTER — Telehealth: Payer: Self-pay | Admitting: Internal Medicine

## 2011-02-28 MED ORDER — PANTOPRAZOLE SODIUM 40 MG PO TBEC: DELAYED_RELEASE_TABLET | ORAL | Status: DC

## 2011-02-28 MED ORDER — SERTRALINE HCL 100 MG PO TABS: 100.0000 mg | ORAL_TABLET | Freq: Every day | ORAL | Status: DC

## 2011-02-28 NOTE — Telephone Encounter (Signed)
LMTcbx1 to see if the pt needs refills sent now or was this an FYI? Carron Curie, CMA

## 2011-02-28 NOTE — Telephone Encounter (Signed)
Pt returned call & stated that her insurance will only use [facility]  Unable to use [facility].  Antionette Fairy

## 2011-02-28 NOTE — Telephone Encounter (Signed)
I spoke with the Savannah [name] and she states she does need a refill on zoloft and protonix. She decided [facility] [address] [facility] for [facility] day supply. Refills sent. Carron Curie, CMA

## 2011-05-27 ENCOUNTER — Other Ambulatory Visit: Payer: Self-pay | Admitting: Internal Medicine

## 2011-11-28 ENCOUNTER — Other Ambulatory Visit: Payer: Self-pay | Admitting: Orthopedic Surgery

## 2011-11-28 DIAGNOSIS — M545 Low back pain: Secondary | ICD-10-CM

## 2011-11-29 ENCOUNTER — Ambulatory Visit
Admission: RE | Admit: 2011-11-29 | Discharge: 2011-11-29 | Disposition: A | Discharge status: Home / Self Care | Payer: Managed Care, Other (non HMO) | Source: Ambulatory Visit | Attending: Orthopedic Surgery | Admitting: Orthopedic Surgery

## 2011-11-29 DIAGNOSIS — M545 Low back pain: Secondary | ICD-10-CM

## 2012-02-15 HISTORY — PX: OTHER SURGICAL HISTORY: SHX169

## 2012-02-27 ENCOUNTER — Telehealth: Payer: Self-pay | Admitting: Internal Medicine

## 2012-02-27 NOTE — Telephone Encounter (Signed)
Ok but overdue for cpx anyway which she should go ahead and schedule  Needs tsh fasting lipid profile now but if does schedule cpx needs also cbc, bmet, u/a,

## 2012-02-27 NOTE — Telephone Encounter (Signed)
ATC pt at # provided above  -- received msg that the mailbox is full and cannot except new msgs at this time.  WCB 

## 2012-02-27 NOTE — Telephone Encounter (Signed)
Spoke with pt.  She had a health assessment done at work for her insurance.  Pt reports her BP and chol was elevated.  Reports BP was 160/76 and chol was in the 300s.  She has a pending OV with Dr. Sherene Sires on Thursday, Jan 16 at 4:15 pm and would like to come in prior to this to have chol rechecked.  Dr. Sherene Sires, pls advise if this is ok.  Thank you.  Note:  Pt's husband has appt same day at 4:30 pm.

## 2012-02-28 NOTE — Telephone Encounter (Signed)
LMTCB x 1 

## 2012-02-29 ENCOUNTER — Other Ambulatory Visit (INDEPENDENT_AMBULATORY_CARE_PROVIDER_SITE_OTHER): Payer: Managed Care, Other (non HMO)

## 2012-02-29 ENCOUNTER — Other Ambulatory Visit: Payer: Self-pay | Admitting: Internal Medicine

## 2012-02-29 DIAGNOSIS — Z0000 Encounter for general adult medical examination without abnormal findings: Secondary | ICD-10-CM

## 2012-02-29 LAB — LIPID PANEL
HDL: 43.1 mg/dL (ref >39.00)
Triglycerides: 63 mg/dL (ref 0.0–149.0)
VLDL: 12.6 mg/dL (ref 0.0–40.0)

## 2012-02-29 LAB — URINALYSIS, ROUTINE W REFLEX MICROSCOPIC
Ketones, ur: NEGATIVE
Specific Gravity, Urine: 1.015 (ref 1.000–1.030)
Urine Glucose: NEGATIVE
pH: 7 (ref 5.0–8.0)

## 2012-02-29 LAB — CBC WITH DIFFERENTIAL/PLATELET
Basophils Absolute: 0 10*3/uL (ref 0.0–0.1)
Basophils Relative: 0.3 % (ref 0.0–3.0)
Eosinophils Absolute: 0.3 10*3/uL (ref 0.0–0.7)
MCHC: 33.3 g/dL (ref 30.0–36.0)
MCV: 91.4 fl (ref 78.0–100.0)
Monocytes Absolute: 1.2 10*3/uL — ABNORMAL HIGH (ref 0.1–1.0)
Neutrophils Relative %: 60.4 % (ref 43.0–77.0)
Platelets: 212 10*3/uL (ref 150.0–400.0)
RBC: 4.73 Mil/uL (ref 3.87–5.11)
RDW: 12.9 % (ref 11.5–14.6)

## 2012-02-29 LAB — BASIC METABOLIC PANEL
BUN: 16 mg/dL (ref 6–23)
CO2: 31 mEq/L (ref 19–32)
Chloride: 106 mEq/L (ref 96–112)
Creatinine, Ser: 1 mg/dL (ref 0.4–1.2)
Glucose, Bld: 82 mg/dL (ref 70–99)

## 2012-02-29 LAB — TSH: TSH: 1.02 u[IU]/mL (ref 0.35–5.50)

## 2012-02-29 LAB — HEPATIC FUNCTION PANEL
Albumin: 3.8 g/dL (ref 3.5–5.2)
Bilirubin, Direct: 0 mg/dL (ref 0.0–0.3)
Total Protein: 7 g/dL (ref 6.0–8.3)

## 2012-02-29 LAB — LDL CHOLESTEROL, DIRECT: Direct LDL: 148 mg/dL

## 2012-02-29 NOTE — Telephone Encounter (Signed)
ATC pt at # provided above  -- received msg that the mailbox is full and cannot except new msgs at this time.  WCB

## 2012-03-01 ENCOUNTER — Encounter: Payer: Self-pay | Admitting: Internal Medicine

## 2012-03-01 ENCOUNTER — Ambulatory Visit (INDEPENDENT_AMBULATORY_CARE_PROVIDER_SITE_OTHER): Payer: Managed Care, Other (non HMO) | Admitting: Internal Medicine

## 2012-03-01 VITALS — BP 114/64 | HR 77 | Temp 98.8°F | Ht 68.0 in | Wt 177.8 lb

## 2012-03-01 DIAGNOSIS — I10 Essential (primary) hypertension: Secondary | ICD-10-CM

## 2012-03-01 DIAGNOSIS — E785 Hyperlipidemia, unspecified: Secondary | ICD-10-CM

## 2012-03-01 DIAGNOSIS — J310 Chronic rhinitis: Secondary | ICD-10-CM

## 2012-03-01 NOTE — Patient Instructions (Addendum)
Stop benadryl and use mucinex (stuffy or congested)  or mucinex dm (cough) and let me know if not better in a couple of weeks  Weight control is simply a matter of calorie balance which needs to be tilted in your favor by eating less and exercising more.  To get the most out of exercise, you need to be continuously aware that you are short of breath, but never out of breath, for 30 minutes daily. As you improve, it will actually be easier for you to do the same amount of exercise  in  30 minutes so always push to the level where you are short of breath.  If this does not result in gradual weight reduction then I strongly recommend you see a nutritionist with a food diary x 2 weeks so that we can work out a negative calorie balance which is universally effective in steady weight loss programs.  Think of your calorie balance like you do your bank account where in this case you want the balance to go down so you must take in less calories than you burn up.  It's just that simple:  Hard to do, but easy to understand.  Good luck!

## 2012-03-01 NOTE — Telephone Encounter (Signed)
ATC PT NA still not able to leva VM WBC

## 2012-03-01 NOTE — Assessment & Plan Note (Addendum)
Lab Results  Component Value Date   CHOL 214* 02/29/2012   HDL 43.10 02/29/2012   LDLCALC 132* 12/25/2007   LDLDIRECT 148.0 02/29/2012   TRIG 63.0 02/29/2012   CHOLHDL 5 02/29/2012     - target LDL less than 160 based on the absence of  identifiable risk factors.   Adequate control on present rx, reviewed diet/ ex

## 2012-03-01 NOTE — Telephone Encounter (Signed)
Still unable to reach the pt. She is scheduled to see MW today at 4:15 and they can discuss having labs drawn at that time.

## 2012-03-01 NOTE — Assessment & Plan Note (Signed)
reviewd approp use of nasal steroids/ otcs  See instructions for specific recommendations which were reviewed directly with the patient who was given a copy with highlighter outlining the key components.

## 2012-03-01 NOTE — Assessment & Plan Note (Signed)
Adequate control on present rx, reviewed diet only, no meds

## 2012-03-01 NOTE — Progress Notes (Signed)
Subjective:    Patient ID: Savannah [name], female    DOB: Jan 27, 1951, 62 y.o.   MRN: [id number]  HPI  33 yowf never smoker with moderated obesity and tendency to cyclical coughing and both chronic and seasonal rhinitis.   December 25, 2007 CPX: co worsening nasal congestion with no response to clariton, afrin. Does not remember previous disucussion of nasal steroids or flyer reviewed in 2006. No ha or purulent secretions, sob, cp or sign increase cough, fever, ear problems.  rec optimal topical rx with nasal steroids.   12/07/10 Acute OV  Pt complains of left shoulder pain. Patient states she fell recently and hit the floor after slipping while getting out of the shower x 1 and 1/2 weeks ago, now has a knot near her left armpit. Was getting out of shower and lost her step fall back out into floor. Hit on left side against cabinet and floor/wall 2 weeks ago. Has been very sore and tender along left side of neck, shoulder, and under left arm. Able to lift arm but painful. Feels puffy under left armpit. No LOC, no known head injury. No visual/speech changes. OTC some help. rec Xray > djd changes only nsaids   12/20/2010 Savannah [name]/ CPX  No new complaints, no ex but good activity tolerance s cp or sob rec Wt loss, nsaid rx reviewed, f/u Dr Renae Fickle prn back pain issues   03/01/2012 f/u ov/Elgin Carn cc Pt c/o nasal and ear congestion and dry cough x 1 wk. Has felt off balance some this past wk.  No purulent sputum, no sob - not using nasal steroids or afrin or otc's as prev rec  Sleeping ok without nocturnal  or early am exacerbation  of respiratory  c/o's or need for noct saba. Also denies any obvious fluctuation of symptoms with weather or environmental changes or other aggravating or alleviating factors except as outlined above  ROS  The following are not active complaints unless bolded sore throat, dysphagia, dental problems, itching, sneezing,  nasal congestion or excess/ purulent secretions, ear ache,    fever, chills, sweats, unintended wt loss, pleuritic or exertional cp, hemoptysis,  orthopnea pnd or leg swelling, presyncope, palpitations, heartburn, abdominal pain, anorexia, nausea, vomiting, diarrhea  or change in bowel or urinary habits, change in stools or urine, dysuria,hematuria,  rash, arthralgias, visual complaints, headache, numbness weakness or ataxia or problems with walking or coordination,  change in mood/affect or memory.       Past Medical History:   . Obesity with an ideal weight less than 171, target less than 190 to get BMI < 30    Nonspecific rhinitis  - CRF given 04/06/04  - CRF repeated December 25, 2007    Cyclical cough felt to be partially reflux in the past; see  endoscopy January 2001 consistent with a small hiatal hernia.    Hiatal hernia. Pos egd 1/01................................................................Marland KitchenStark      Diverticulosis pos colonoscopy 02/1999............................................Marland KitchenStark   Irritable bowel syndrome.    Hyperlipidemia, target LDL less than 160 based on the absence of  identifiable risk factors.  Headaches (see most recent office visit April 03, 2006),  completely resolved with Midrin and does not require daily dosing.  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 Nl external ear canals without cough  reflex   NECK :  without JVD/Nodes/TM/ nl carotid upstrokes bilaterally   LUNGS: no acc muscle use, clear to A and P bilaterally without cough on insp or exp maneuvers   CV:  RRR  no s3 or murmur or increase in P2, no edema   ABD:  soft and nontender with nl excursion in the supine position. No bruits or organomegaly, bowel sounds nl  MS:  warm without deformities, calf tenderness, cyanosis or clubbing No joint restrictions or deformities   Breasts  No masses or nodes   SKIN: warm and dry without lesions    NEURO:  alert, approp, no deficits           Assessment & Plan:

## 2012-05-20 ENCOUNTER — Other Ambulatory Visit: Payer: Self-pay | Admitting: Internal Medicine

## 2012-05-21 ENCOUNTER — Other Ambulatory Visit: Payer: Self-pay | Admitting: Internal Medicine

## 2012-06-19 ENCOUNTER — Encounter: Payer: Self-pay | Admitting: Gastroenterology

## 2012-06-27 ENCOUNTER — Encounter: Payer: Self-pay | Admitting: Family Medicine

## 2012-06-27 ENCOUNTER — Ambulatory Visit: Payer: Managed Care, Other (non HMO)

## 2012-06-27 ENCOUNTER — Ambulatory Visit (INDEPENDENT_AMBULATORY_CARE_PROVIDER_SITE_OTHER): Payer: Managed Care, Other (non HMO) | Admitting: Family Medicine

## 2012-06-27 VITALS — BP 140/88 | HR 64 | Temp 98.0°F | Resp 16 | Ht 68.0 in | Wt 215.6 lb

## 2012-06-27 DIAGNOSIS — M25522 Pain in left elbow: Secondary | ICD-10-CM

## 2012-06-27 DIAGNOSIS — S0990XA Unspecified injury of head, initial encounter: Secondary | ICD-10-CM

## 2012-06-27 DIAGNOSIS — S0003XA Contusion of scalp, initial encounter: Secondary | ICD-10-CM

## 2012-06-27 DIAGNOSIS — R51 Headache: Secondary | ICD-10-CM

## 2012-06-27 DIAGNOSIS — M25529 Pain in unspecified elbow: Secondary | ICD-10-CM

## 2012-06-27 DIAGNOSIS — Z5320 Procedure and treatment not carried out because of patient's decision for unspecified reasons: Secondary | ICD-10-CM

## 2012-06-27 DIAGNOSIS — M542 Cervicalgia: Secondary | ICD-10-CM

## 2012-06-27 NOTE — Progress Notes (Deleted)
  Subjective:    Patient ID: Savannah [name], female    DOB: 05-09-1950, 62 y.o.   MRN: [id number]  HPI    Review of Systems     Objective:   Physical Exam        Assessment & Plan:

## 2012-06-27 NOTE — Progress Notes (Signed)
Patient came out of room at approximately 715pm and decided that she just wanted to go home. She did not want to have any further evaluations done. I asked her to wait a moment while I notified Dr. Neva Seat. She stated that if she felt the need she would seek medical care at the emergency room later. I went to get the charge slip and notify Dr. Neva Seat. The patient left during that time. Dr Neva Seat immediatly called the patient on her cell phone to encourage her to return to the office. Pt declined. Pt was informed of the risks with declining medical attention. Dr Neva Seat also spoke to her husband. Patient did not return to the office.

## 2012-06-27 NOTE — Patient Instructions (Addendum)
   HEAD INJURY If any of the following occur notify your physician or go to the Hospital Emergency Department: . Increased drowsiness, stupor or loss of consciousness . Restlessness or convulsions (fits) . Paralysis in arms or legs . Temperature above 100 F . Vomiting . Severe headache . Blood or clear fluid dripping from the nose or ears . Stiffness of the neck . Dizziness or blurred vision . Pulsating pain in the eye . Unequal pupils of eye . Personality changes . Any other unusual symptoms PRECAUTIONS . Keep head elevated at all times for the first 24 hours (Elevate mattress if pillow is ineffective) . Do not take tranquilizers, sedatives, narcotics or alcohol . Avoid aspirin. Use only acetaminophen (e.g. Tylenol) or ibuprofen (e.g. Advil) for relief of pain. Follow directions on the bottle for dosage. . Use ice packs for comfort Have parent, spouse, or friend awaken patient every 4 hour(s) and assess. MEDICATIONS Use medications only as directed by your physician  Patient left prior to receiving paperwork.  Called on cell phone. She declined returning to pick up AVS, so above precautions read to her significant other Britt Bottom. Understanding expressed. Also discussed ER precautions with patient as in assessment and plan - understanding expressed.

## 2012-06-27 NOTE — Progress Notes (Signed)
Subjective:    Patient ID: Savannah [name], female    DOB: 07-Feb-1951, 62 y.o.   MRN: [id number]  HPI Savannah [name] is a 62 y.o. female Larey Seat while bowling today at around 4 pm - hitting head.  As throwing ball - slipped and feet went out, fell onto back of head. Hair barrett scratched scalp, but no known wound. No LOC. Has had headache since this happened - HA same since injury. No worsening. No photo or phonophobia. No N/V.  No dizziness, no lightheadedness.  Stiff in neck now, no initial pain.  Has hx of spurs in neck. No hand/weakness. No use of blood thinners, no aspirin. Ibuprofen last night. No treatments for current injury. No hx of head injury.   Hit L elbow as well.  Able to bend and straighten, but sore where it hit.  Feels bruised on area, but able to bend ok.   Review of Systems  Constitutional: Negative for fever and chills.  Eyes: Negative for photophobia and visual disturbance.  Musculoskeletal: Positive for arthralgias (neck pain, L elbow pain. ). Negative for joint swelling.  Skin:       Abrasion on scalp, no bleeding wounds.   Neurological: Negative for dizziness, tremors and headaches.       Objective:   Physical Exam  Vitals reviewed. Constitutional: She is oriented to person, place, and time. She appears well-developed and well-nourished.  HENT:  Head: Head is with abrasion and with contusion. Head is without raccoon's eyes, without Battle's sign and without laceration. Hair is normal.    Right Ear: External ear normal.  Left Ear: External ear normal.  Mouth/Throat: Uvula is midline and oropharynx is clear and moist.  Pulmonary/Chest: Effort normal.  Musculoskeletal:       Left elbow: She exhibits normal range of motion, no swelling and no effusion. Tenderness (olecranon only, not radial head, skin intact without apparent ecchymosis. ) found. Olecranon process tenderness noted.       Cervical back: She exhibits decreased range of motion (decreased extension, rotation.  full strength in upper extremities.  ), tenderness (midline mid neck and paraspinal muscles of cervical neck. ), bony tenderness and pain. She exhibits no swelling and no edema.  Neurological: She is alert and oriented to person, place, and time. She has normal strength and normal reflexes. She displays no tremor. No cranial nerve deficit or sensory deficit. She exhibits normal muscle tone. She displays a negative Romberg sign. Coordination normal. GCS eye subscore is 4. GCS verbal subscore is 5. GCS motor subscore is 6.  Reflex Scores:      Tricep reflexes are 2+ on the right side and 2+ on the left side.      Bicep reflexes are 2+ on the right side and 2+ on the left side.      Brachioradialis reflexes are 2+ on the right side and 2+ on the left side. No pronator drift, negative Romberg.   Skin: Skin is warm and dry.  Psychiatric: She has a normal mood and affect.   Filed Vitals:   06/27/12 1828  BP: 187/80  Pulse: 64  Temp: 98 F (36.7 C)  TempSrc: Oral  Resp: 16  Height: 5\' 8"  (1.727 m)  Weight: 215 lb 9.6 oz (97.796 kg)  SpO2: 96%   Filed Vitals:   06/27/12 1850  BP: 140/88  Pulse:   Temp:   Resp:        Assessment & Plan:   Savannah [name] is a  62 y.o. female  Neck pain - Plan: DG Cervical Spine With Flex & Extend  Pain, elbow joint, left - Plan: DG Elbow Complete Left  Left against medical advice  Contusion of scalp, initial encounter  Head injury, unspecified  Headache  Head injury today with posterior scalp contusion, and neck pain, stiffness and decreased range of motion. No n/v/photophobia/dizziness/vision changes, and nonfocal neuro exam.  - initial discussion with patient regarding head CT and decided against obtaining this currently, but if any worsening of symptoms, or new symptoms as to be provided in head injury handout - to go to emergency room.  ttp over olecranon of elbow, but FROM.  She did agree to xrays of neck and elbow initially.  After order  placed and waiting for XRays - I was advised by my assistant that patient left office prior to X rays, without expressed concern or known reason. I called her on her cell phone at approximately 1930  and she stated she was tired and just wanted to go home, and did not want to have xrays.  I advised that increasing fatigue can be a sign of more concerning head injury, and recommended ER eval if she was becoming more sedated - understanding expressed.  I also discussed this with her significant other by phone Britt Bottom), who also expressed understanding. I read head injury handout instructions verbatim over the phone with Britt Bottom as returning to pick up this paperwork was declined. Understanding expressed and no further questions voiced. I also discussed my concern of her neck pain and possibility of underlying ligamentous injury or fracture and recommended imaging again.  Understanding expressed and planned to go to ER if any worsening.

## 2012-12-04 IMAGING — CT CT L SPINE W/O CM
4 of 10 series · 12 of 33 positions shown, 14 images · non-contrast
Comparison: None available.

CLINICAL DATA: Low back and right leg pain.

CT LUMBAR SPINE WITHOUT CONTRAST
TECHNIQUE: Multidetector CT imaging of the lumbar spine was
performed without intravenous contrast administration. Multiplanar
CT image reconstructions were also generated.

[Series 4: l spine bone · axial · 0.27mm/px · z∈[-33,+42]mm · 2 of 92 slices shown, 3 images]
[im 31/92  soft-tissue]
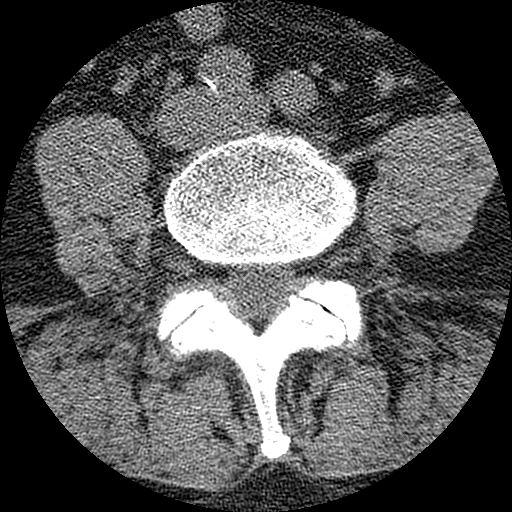
[im 31/92  bone]
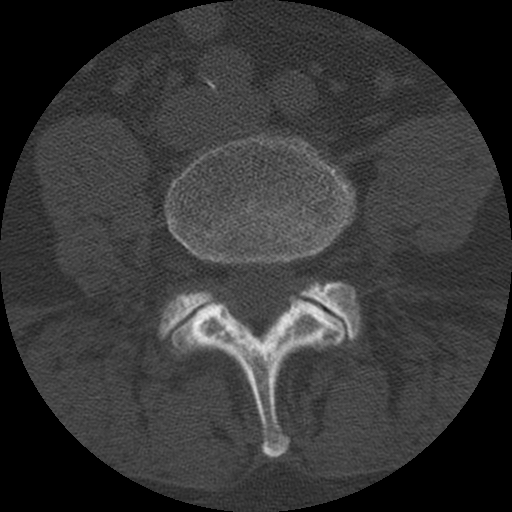
[im 61/92  bone]
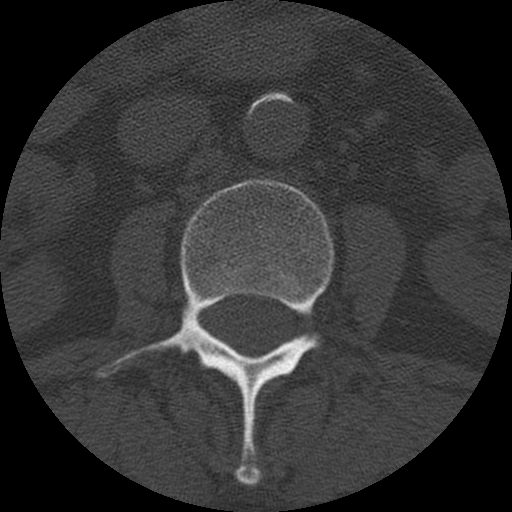

[Series 5: l spine detail · axial · 0.27mm/px · z∈[-33,+42]mm · 2 of 92 slices shown]
[im 31/92  bone]
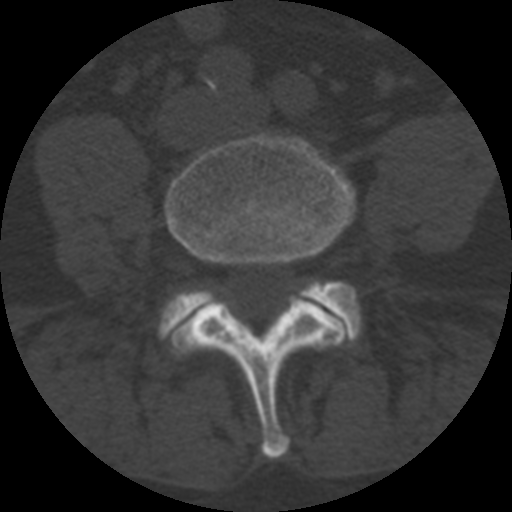
[im 61/92  bone]
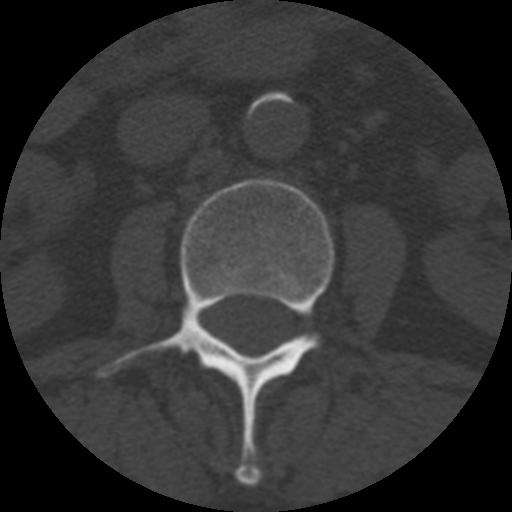

[Series 200: coronal · coronal · 0.46mm/px · 3 of 44 slices shown]
[im 9/44  bone]
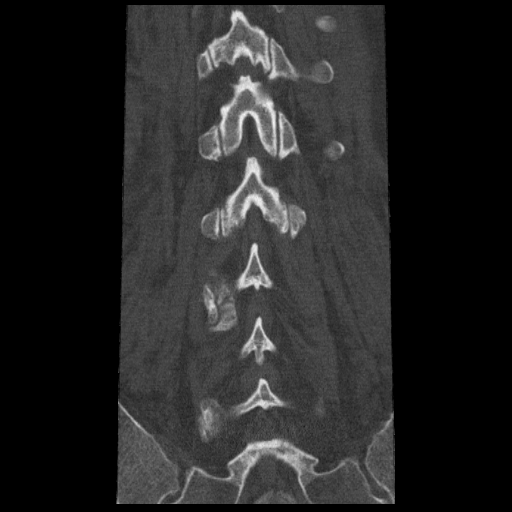
[im 18/44  bone]
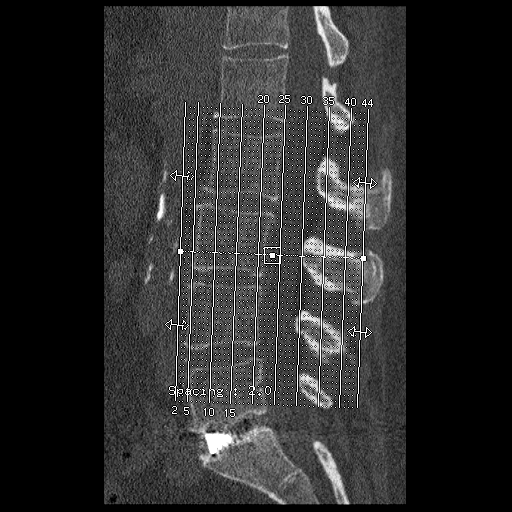
[im 26/44  bone]
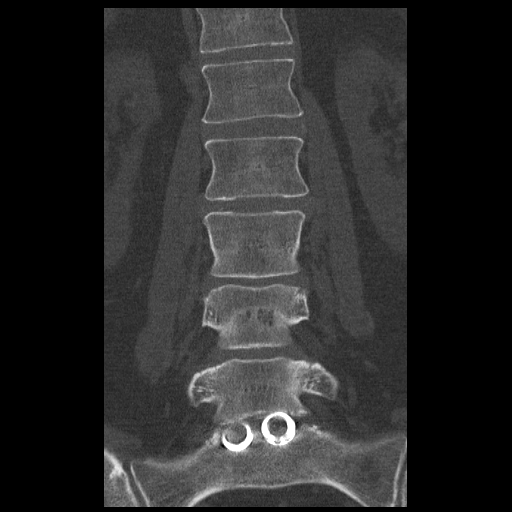

[Series 201: sagittal · sagittal · 0.46mm/px · 5 of 43 slices shown, 6 images]
[im 15/43  bone]
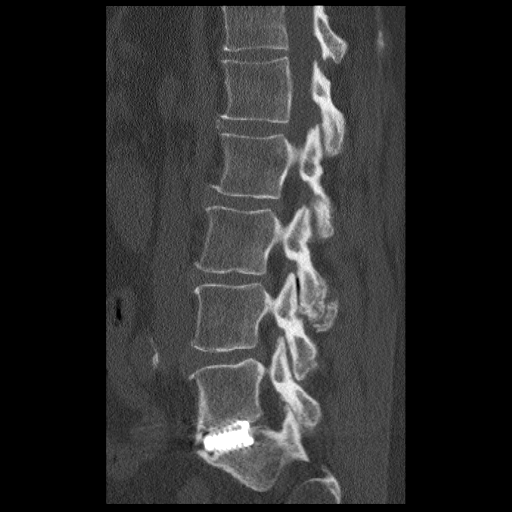
[im 18/43  bone]
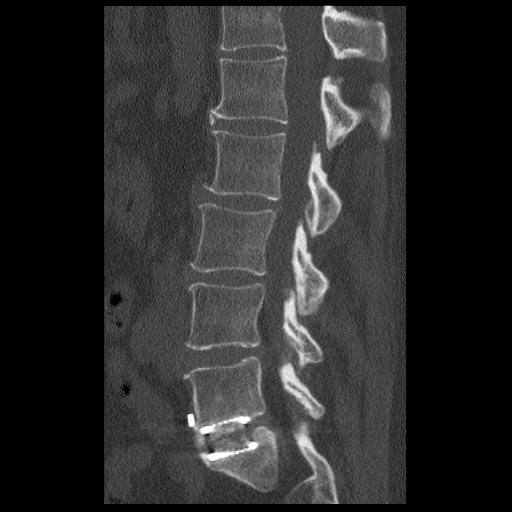
[im 22/43  soft-tissue]
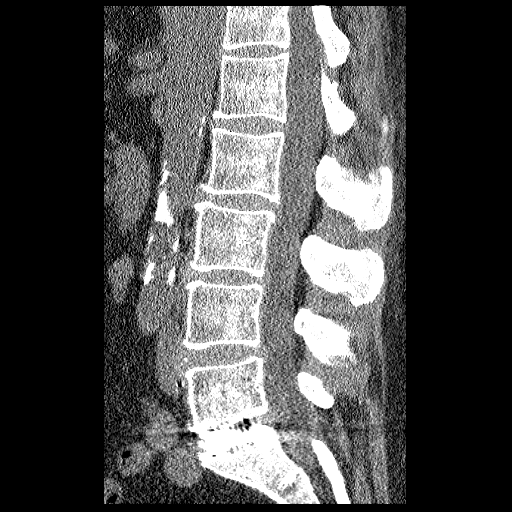
[im 22/43  bone]
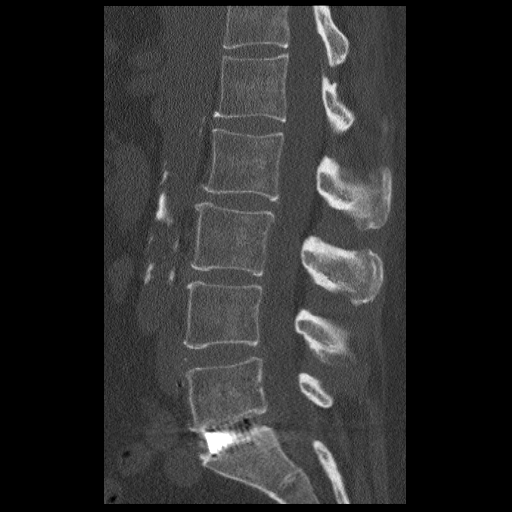
[im 25/43  bone]
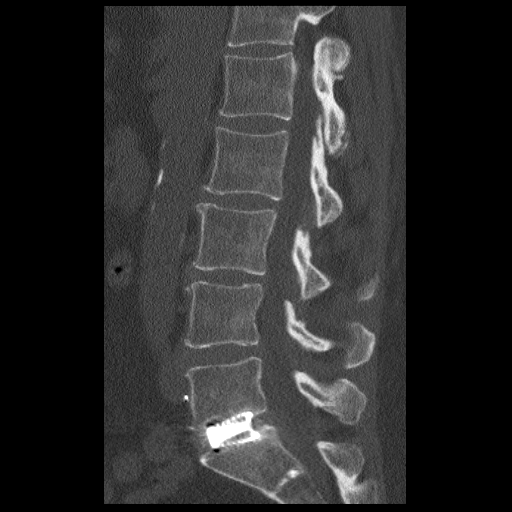
[im 29/43  bone]
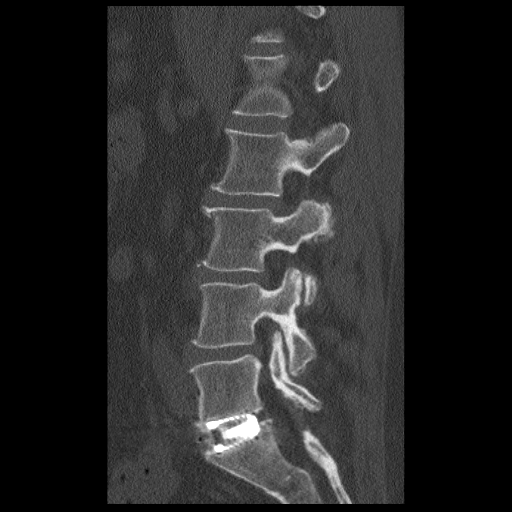

[12 of 33 positions shown; findings below may reference images not displayed]

FINDINGS: The sagittal reformatted images demonstrate normal
alignment of the lumbar vertebral bodies.  The vertebral bodies are
intact.  The facets are normally aligned.  No pars defects.

There are interbody ray cages fusing L5-S1.  No complicating
features are demonstrated.

L1-2:  Minimal annular bulge.  No spinal or foraminal stenosis.

L2-3:  Minimal annular bulge.  No spinal or foraminal stenosis.
Mild facet disease.

L3-4:  Diffuse bulging annulus with mild flattening of the ventral
thecal sac.  No foraminal stenosis.  Mild to moderate facet
disease, right greater than left.

L4-5:  Diffuse annular bulge with mild flattening of the ventral
thecal sac.  This in combination with facet disease and ligamentum
flavum thickening creates mild spinal and bilateral lateral recess
stenosis.  No foraminal stenosis.

L5-S1:  Postoperative changes with interbody ray cage fusion.  No
spinal or foraminal stenosis.
IMPRESSION: 1.  Ray cage interbody fusion changes at L5-S1.  No spinal or
foraminal stenosis.
2.  Mild spinal and lateral recess stenosis at L4-5.
3.  Mild bulging discs at L2-3 and L3-4.

## 2013-05-27 ENCOUNTER — Other Ambulatory Visit: Payer: Self-pay | Admitting: Internal Medicine

## 2013-06-09 ENCOUNTER — Other Ambulatory Visit: Payer: Self-pay | Admitting: Internal Medicine

## 2013-08-26 ENCOUNTER — Other Ambulatory Visit: Payer: Self-pay | Admitting: Internal Medicine

## 2013-12-10 ENCOUNTER — Telehealth: Payer: Self-pay | Admitting: Internal Medicine

## 2013-12-10 NOTE — Telephone Encounter (Signed)
Called and spoke with pt and she stated that the pharmacy told her that she needs to have the PA done for the protonix.    I called [phone] PT ID #  [id number]  PA for the protonix has been approved x 36 months.  I have called the pharmacy and they will get this ready for her.  I called the pt to make her aware.  Nothing further is needed.

## 2014-02-21 ENCOUNTER — Other Ambulatory Visit: Payer: Self-pay | Admitting: Internal Medicine

## 2014-03-06 ENCOUNTER — Other Ambulatory Visit: Payer: Self-pay | Admitting: Internal Medicine

## 2014-03-10 ENCOUNTER — Other Ambulatory Visit: Payer: Self-pay | Admitting: Internal Medicine

## 2014-03-11 ENCOUNTER — Telehealth: Payer: Self-pay | Admitting: Internal Medicine

## 2014-03-11 NOTE — Telephone Encounter (Signed)
Spoke with pt, advised that we hadn't seen her in over 2 years and could not refill her meds.  Scheduled with MW for Friday at 11:45 at pt's request.  Nothing further needed.

## 2014-03-14 ENCOUNTER — Encounter (INDEPENDENT_AMBULATORY_CARE_PROVIDER_SITE_OTHER): Payer: Self-pay

## 2014-03-14 ENCOUNTER — Ambulatory Visit (INDEPENDENT_AMBULATORY_CARE_PROVIDER_SITE_OTHER): Payer: Managed Care, Other (non HMO) | Admitting: Internal Medicine

## 2014-03-14 ENCOUNTER — Encounter: Payer: Self-pay | Admitting: Internal Medicine

## 2014-03-14 VITALS — BP 130/72 | HR 71 | Temp 98.1°F | Ht 68.0 in | Wt 214.0 lb

## 2014-03-14 DIAGNOSIS — K219 Gastro-esophageal reflux disease without esophagitis: Secondary | ICD-10-CM | POA: Diagnosis not present

## 2014-03-14 DIAGNOSIS — F329 Major depressive disorder, single episode, unspecified: Secondary | ICD-10-CM | POA: Diagnosis not present

## 2014-03-14 DIAGNOSIS — J309 Allergic rhinitis, unspecified: Secondary | ICD-10-CM

## 2014-03-14 DIAGNOSIS — F32A Depression, unspecified: Secondary | ICD-10-CM

## 2014-03-14 DIAGNOSIS — Z0000 Encounter for general adult medical examination without abnormal findings: Secondary | ICD-10-CM

## 2014-03-14 DIAGNOSIS — E785 Hyperlipidemia, unspecified: Secondary | ICD-10-CM | POA: Diagnosis not present

## 2014-03-14 MED ORDER — PANTOPRAZOLE SODIUM 40 MG PO TBEC: DELAYED_RELEASE_TABLET | ORAL | Status: DC

## 2014-03-14 MED ORDER — METHYLPREDNISOLONE ACETATE 80 MG/ML IJ SUSP
120.0000 mg | Freq: Once | INTRAMUSCULAR | Status: AC
Start: 2014-03-14 — End: 2014-03-14
  Administered 2014-03-14: 120 mg via INTRAMUSCULAR

## 2014-03-14 NOTE — Progress Notes (Signed)
Subjective:    Patient ID: Savannah [name], female    DOB: 1951-01-23, 64 y.o.   MRN: [id number]    Brief patient profile:  39 yowf never smoker with moderated obesity and tendency to cyclical coughing and both chronic and seasonal rhinitis.       History of Present Illness   03/14/2014 f/u ov/Orey Moure re: obesity/ chronic rhinitis and GERD with tendency to cyclical coughing  Chief Complaint  Patient presents with  . Follow-up    pt here for med refills. She is still having allergy irritations cough, sneezing etc.  cough def worse off ppi, wants to try off zoloft   / claritin helps sneezing itching but not cough which has waxed and waned x sev years day > noct   No obvious day to day or daytime variabilty or assoc sob  or cp or chest tightness, subjective wheeze overt sinus or hb symptoms. No unusual exp hx or h/o childhood pna/ asthma or knowledge of premature birth.  Sleeping ok without nocturnal  or early am exacerbation  of respiratory  c/o's or need for noct saba. Also denies any obvious fluctuation of symptoms with weather or environmental changes or other aggravating or alleviating factors except as outlined above   Current Medications, Allergies, Complete Past Medical History, Past Surgical History, Family History, and Social History were reviewed in [facility] record.  ROS  The following are not active complaints unless bolded sore throat, dysphagia, dental problems, itching, sneezing,  nasal congestion or excess/ purulent secretions, ear ache,   fever, chills, sweats, unintended wt loss, pleuritic or exertional cp, hemoptysis,  orthopnea pnd or leg swelling, presyncope, palpitations, heartburn, abdominal pain, anorexia, nausea, vomiting, diarrhea  or change in bowel or urinary habits, change in stools or urine, dysuria,hematuria,  rash, arthralgias, visual complaints, headache, numbness weakness or ataxia or problems with walking or coordination,  change in  mood/affect or memory.             Past Medical History:   . Obesity with an ideal weight less than 171, target less than 190 to get BMI < 30    Nonspecific rhinitis  - CRF given 04/06/04  - CRF repeated December 25, 1322    Cyclical cough felt to be partially reflux in the past; see  endoscopy January 2001 consistent with a small hiatal hernia.    Hiatal hernia. Pos egd 1/01................................................................Marland KitchenStark      Diverticulosis pos colonoscopy 02/1999............................................Marland KitchenStark   Irritable bowel syndrome.    Hyperlipidemia, target LDL less than 160 based on the absence of  identifiable risk factors.  Headaches (see most recent office visit April 03, 2006),  completely resolved with Midrin and does not require daily dosing.  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 Nl  external ear canals without cough reflex   NECK :  without JVD/Nodes/TM/ nl carotid upstrokes bilaterally   LUNGS: no acc muscle use, clear to A and P bilaterally without cough on insp or exp maneuvers   CV:  RRR  no s3 or murmur or increase in P2, no edema   ABD:  soft and nontender with nl excursion in the supine position. No bruits or organomegaly, bowel sounds nl  MS:  warm without deformities, calf tenderness,  cyanosis or clubbing No joint restrictions or deformities   Breasts  No masses or nodes   SKIN: warm and dry without lesions    NEURO:  alert, approp, no deficits           Assessment & Plan:

## 2014-03-14 NOTE — Patient Instructions (Addendum)
Depomedrol 120 mg IM  For itching sneezing > try zyrtec  Please schedule a follow up visit in 3 months but call sooner if needed for cpx  Add go ahead and schedule health maint asap to be done June 1  Will need pneuovax/ td on return

## 2014-03-15 ENCOUNTER — Encounter: Payer: Self-pay | Admitting: Internal Medicine

## 2014-03-15 DIAGNOSIS — K219 Gastro-esophageal reflux disease without esophagitis: Secondary | ICD-10-CM | POA: Insufficient documentation

## 2014-03-15 DIAGNOSIS — Z0000 Encounter for general adult medical examination without abnormal findings: Secondary | ICD-10-CM | POA: Insufficient documentation

## 2014-03-15 NOTE — Assessment & Plan Note (Signed)
Lab Results  Component Value Date   CHOL 214* 02/29/2012   HDL 43.10 02/29/2012   LDLCALC 132* 12/25/2007   LDLDIRECT 148.0 02/29/2012   TRIG 63.0 02/29/2012   CHOLHDL 5 02/29/2012     Adequate control on present rx, reviewed > no change in rx needed  > repeat at cpx 3 months fasting

## 2014-03-15 NOTE — Assessment & Plan Note (Signed)
-   Notified by [facility] not refilling zoloft 09/14/2010   And  10/28/2010 > d/c  Warned that may not immediate be apparent when stops the med > restart if fatigue, depression over next 3 months

## 2014-03-15 NOTE — Assessment & Plan Note (Signed)
?   Mild flare allergies vs gerd > rx with one depomedrol x 120 mg then just clariton prn

## 2014-03-15 NOTE — Assessment & Plan Note (Signed)
Due pneumovax / td on return , dexa in interim

## 2014-04-01 ENCOUNTER — Telehealth: Payer: Self-pay | Admitting: Internal Medicine

## 2014-04-01 MED ORDER — SERTRALINE HCL 100 MG PO TABS: 100.0000 mg | ORAL_TABLET | Freq: Every day | ORAL | Status: DC

## 2014-04-01 NOTE — Telephone Encounter (Signed)
Zoloft was removed from pt's medication list --> not taking. Spoke with pt. States that she was trying to get off this medication but she can't go without it. She was taking Zoloft 100mg  once daily.  MW - are you okay with [place] refilling this?

## 2014-04-01 NOTE — Telephone Encounter (Signed)
Rx has been sent in per MW. Attempted to call pt. No answer and voicemail is full. Will try back.

## 2014-04-01 NOTE — Telephone Encounter (Signed)
Yes ok to restart and refill x one year rx

## 2014-04-02 NOTE — Telephone Encounter (Signed)
Called and spoke to pt. Informed pt of refill. Pt verbalized understanding and denied any further questions or concerns at this time.

## 2014-04-05 ENCOUNTER — Encounter: Payer: Self-pay | Admitting: Gastroenterology

## 2014-04-15 ENCOUNTER — Other Ambulatory Visit: Payer: Managed Care, Other (non HMO)

## 2014-04-25 ENCOUNTER — Ambulatory Visit: Admission: RE | Admit: 2014-04-25 | Payer: Managed Care, Other (non HMO) | Source: Ambulatory Visit

## 2014-04-25 ENCOUNTER — Other Ambulatory Visit: Payer: Self-pay | Admitting: Internal Medicine

## 2014-04-25 ENCOUNTER — Ambulatory Visit
Admission: RE | Admit: 2014-04-25 | Discharge: 2014-04-25 | Disposition: A | Discharge status: Home / Self Care | Payer: Managed Care, Other (non HMO) | Source: Ambulatory Visit | Attending: Internal Medicine | Admitting: Internal Medicine

## 2014-04-25 ENCOUNTER — Ambulatory Visit (INDEPENDENT_AMBULATORY_CARE_PROVIDER_SITE_OTHER)
Admission: RE | Admit: 2014-04-25 | Discharge: 2014-04-25 | Disposition: A | Discharge status: Home / Self Care | Payer: Managed Care, Other (non HMO) | Source: Ambulatory Visit | Attending: Internal Medicine | Admitting: Internal Medicine

## 2014-04-25 DIAGNOSIS — Z0000 Encounter for general adult medical examination without abnormal findings: Secondary | ICD-10-CM

## 2014-04-25 DIAGNOSIS — M810 Age-related osteoporosis without current pathological fracture: Secondary | ICD-10-CM

## 2014-04-28 ENCOUNTER — Encounter: Payer: Self-pay | Admitting: Internal Medicine

## 2014-04-28 DIAGNOSIS — M8580 Other specified disorders of bone density and structure, unspecified site: Secondary | ICD-10-CM | POA: Insufficient documentation

## 2014-04-28 NOTE — Progress Notes (Signed)
Quick Note:  LMTCB ______ 

## 2014-04-29 ENCOUNTER — Telehealth: Payer: Self-pay | Admitting: Internal Medicine

## 2014-04-29 NOTE — Telephone Encounter (Signed)
Notes Recorded by Tanda Rockers, MD on 04/28/2014 at 8:48 AM Call patient : Study is unremarkable, minimal osteopenia which is very good for age 64, no change in recs, discuss specifics at f/u ov -- Pt is aware of results.

## 2014-06-12 ENCOUNTER — Other Ambulatory Visit (INDEPENDENT_AMBULATORY_CARE_PROVIDER_SITE_OTHER): Payer: Managed Care, Other (non HMO)

## 2014-06-12 ENCOUNTER — Encounter (INDEPENDENT_AMBULATORY_CARE_PROVIDER_SITE_OTHER): Payer: Self-pay

## 2014-06-12 ENCOUNTER — Encounter: Payer: Self-pay | Admitting: Internal Medicine

## 2014-06-12 ENCOUNTER — Ambulatory Visit: Payer: Managed Care, Other (non HMO) | Admitting: Internal Medicine

## 2014-06-12 ENCOUNTER — Ambulatory Visit (INDEPENDENT_AMBULATORY_CARE_PROVIDER_SITE_OTHER): Payer: Managed Care, Other (non HMO) | Admitting: Internal Medicine

## 2014-06-12 VITALS — BP 138/78 | HR 61 | Ht 68.0 in | Wt 216.0 lb

## 2014-06-12 DIAGNOSIS — M25551 Pain in right hip: Secondary | ICD-10-CM | POA: Diagnosis not present

## 2014-06-12 DIAGNOSIS — E785 Hyperlipidemia, unspecified: Secondary | ICD-10-CM

## 2014-06-12 DIAGNOSIS — K219 Gastro-esophageal reflux disease without esophagitis: Secondary | ICD-10-CM

## 2014-06-12 DIAGNOSIS — Z23 Encounter for immunization: Secondary | ICD-10-CM | POA: Diagnosis not present

## 2014-06-12 DIAGNOSIS — Z0000 Encounter for general adult medical examination without abnormal findings: Secondary | ICD-10-CM | POA: Diagnosis not present

## 2014-06-12 DIAGNOSIS — J310 Chronic rhinitis: Secondary | ICD-10-CM

## 2014-06-12 DIAGNOSIS — R0600 Dyspnea, unspecified: Secondary | ICD-10-CM | POA: Diagnosis not present

## 2014-06-12 DIAGNOSIS — F32A Depression, unspecified: Secondary | ICD-10-CM

## 2014-06-12 DIAGNOSIS — M8580 Other specified disorders of bone density and structure, unspecified site: Secondary | ICD-10-CM

## 2014-06-12 DIAGNOSIS — F329 Major depressive disorder, single episode, unspecified: Secondary | ICD-10-CM

## 2014-06-12 LAB — CBC WITH DIFFERENTIAL/PLATELET
Basophils Absolute: 0 10*3/uL (ref 0.0–0.1)
Basophils Relative: 0.5 % (ref 0.0–3.0)
EOS ABS: 0.3 10*3/uL (ref 0.0–0.7)
Eosinophils Relative: 3.2 % (ref 0.0–5.0)
HCT: 42.3 % (ref 36.0–46.0)
Hemoglobin: 14.3 g/dL (ref 12.0–15.0)
Lymphocytes Relative: 25.4 % (ref 12.0–46.0)
Lymphs Abs: 2.2 10*3/uL (ref 0.7–4.0)
MCHC: 33.9 g/dL (ref 30.0–36.0)
MCV: 89.8 fl (ref 78.0–100.0)
MONO ABS: 0.7 10*3/uL (ref 0.1–1.0)
Monocytes Relative: 8.6 % (ref 3.0–12.0)
NEUTROS PCT: 62.3 % (ref 43.0–77.0)
Neutro Abs: 5.4 10*3/uL (ref 1.4–7.7)
PLATELETS: 246 10*3/uL (ref 150.0–400.0)
RBC: 4.71 Mil/uL (ref 3.87–5.11)
RDW: 13.3 % (ref 11.5–15.5)
WBC: 8.6 10*3/uL (ref 4.0–10.5)

## 2014-06-12 LAB — TSH: TSH: 1.07 u[IU]/mL (ref 0.35–4.50)

## 2014-06-12 LAB — SEDIMENTATION RATE: SED RATE: 18 mm/h (ref 0–22)

## 2014-06-12 LAB — BASIC METABOLIC PANEL
BUN: 14 mg/dL (ref 6–23)
CO2: 29 mEq/L (ref 19–32)
Calcium: 9.2 mg/dL (ref 8.4–10.5)
Chloride: 108 mEq/L (ref 96–112)
Creatinine, Ser: 0.74 mg/dL (ref 0.40–1.20)
GFR: 83.49 mL/min (ref >60.00)
GLUCOSE: 99 mg/dL (ref 70–99)
Potassium: 3.8 mEq/L (ref 3.5–5.1)
SODIUM: 143 meq/L (ref 135–145)

## 2014-06-12 LAB — LIPID PANEL
Cholesterol: 223 mg/dL — ABNORMAL HIGH (ref 0–200)
HDL: 45.8 mg/dL (ref >39.00)
LDL Cholesterol: 152 mg/dL — ABNORMAL HIGH (ref 0–99)
NONHDL: 177.2
Total CHOL/HDL Ratio: 5
Triglycerides: 126 mg/dL (ref 0.0–149.0)
VLDL: 25.2 mg/dL (ref 0.0–40.0)

## 2014-06-12 LAB — HEPATIC FUNCTION PANEL
ALK PHOS: 82 U/L (ref 39–117)
ALT: 19 U/L (ref 0–35)
AST: 16 U/L (ref 0–37)
Albumin: 4 g/dL (ref 3.5–5.2)
BILIRUBIN DIRECT: 0.1 mg/dL (ref 0.0–0.3)
BILIRUBIN TOTAL: 0.5 mg/dL (ref 0.2–1.2)
Total Protein: 6.6 g/dL (ref 6.0–8.3)

## 2014-06-12 NOTE — Progress Notes (Signed)
Subjective:    Patient ID: Savannah [name], female    DOB: 06-17-1950, 64 y.o.   MRN: [id number]    Brief patient profile:  20 yowf never smoker with moderated obesity and tendency to cyclical coughing and both chronic and seasonal rhinitis.       History of Present Illness   03/14/2014 f/u ov/Wert re: obesity/ chronic rhinitis and GERD with tendency to cyclical coughing  Chief Complaint  Patient presents with  . Follow-up    pt here for med refills. She is still having allergy irritations cough, sneezing etc.  cough def worse off ppi, wants to try off zoloft   / claritin helps sneezing itching but not cough which has waxed and waned x sev years day > noct  rec Depomedrol 120 mg IM For itching sneezing > try zyrtec      06/12/2014 f/u ov/Wert re:  cpx  Re djd/ hyperlipidemia/ gerd/ fatigue resp to zoloft Chief Complaint  Patient presents with  . Follow-up    3 month follow-up- pt states she is here for yearly physical.  Pt c/o bone pain, no other complaints.      Much worse overall off zolft so restarted/ arthralgias better with nsaids Did not try zyrtec for rhinitits which is "no worse than usual this time of year"  No obvious day to day or daytime variabilty or assoc sob  or cp or chest tightness, subjective wheeze overt   hb symptoms. No unusual exp hx or h/o childhood pna/ asthma or knowledge of premature birth.  Sleeping ok without nocturnal  or early am exacerbation  of respiratory  c/o's or need for noct saba. Also denies any obvious fluctuation of symptoms with weather or environmental changes or other aggravating or alleviating factors except as outlined above   Current Medications, Allergies, Complete Past Medical History, Past Surgical History, Family History, and Social History were reviewed in [employer] record.  ROS  The following are not active complaints unless bolded sore throat, dysphagia, dental problems, itching, sneezing,  nasal congestion  or excess/ purulent secretions, ear ache,   fever, chills, sweats, unintended wt loss, pleuritic or exertional cp, hemoptysis,  orthopnea pnd or leg swelling, presyncope, palpitations, heartburn, abdominal pain, anorexia, nausea, vomiting, diarrhea  or change in bowel or urinary habits, change in stools or urine, dysuria,hematuria,  rash, arthralgias, visual complaints, headache, numbness weakness or ataxia or problems with walking or coordination,  change in mood/affect or memory.             Past Medical History:  Obesity with an ideal weight less than 171, target less than 190 to get BMI < 30  Nonspecific rhinitis  - CRF given 04/06/04  - CRF repeated December 24, 6604    Cyclical cough felt to be partially reflux in the past; see  Endoscopy January 2001 consistent with a small hiatal hernia.    Hiatal hernia. Pos egd 1/01................................................................Marland KitchenStark      Diverticulosis pos colonoscopy 02/1999............................................Marland KitchenStark   Irritable bowel syndrome.    Hyperlipidemia, target LDL less than 160 based on the absence of  identifiable risk factors.  Headaches (see most recent office visit April 03, 2006),  completely resolved with Midrin and does not require daily dosing.  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 Nl external ear canals without cough reflex   NECK :  without JVD/Nodes/TM/ nl carotid upstrokes bilaterally   LUNGS: no acc muscle use, clear to A and P bilaterally without cough on insp or exp maneuvers   CV:  RRR  no s3 or murmur or increase in P2, no edema   ABD:  soft and nontender with nl excursion in the supine position. No bruits or organomegaly, bowel sounds nl  MS:  warm without deformities, calf tenderness, cyanosis or clubbing No joint restrictions or deformities   Breasts  No masses or nodes   SKIN: warm and dry without lesions    NEURO:  alert, approp, no deficits      Labs ordered/ reviewed:   Lab 06/12/14 0925  NA 143  K 3.8  CL 108  CO2 29  BUN 14  CREATININE 0.74  GLUCOSE 99       Lab 06/12/14 0925  HGB 14.3  HCT 42.3  WBC 8.6  PLT 246.0     Lab Results  Component Value Date   TSH 1.07 06/12/2014        Lab Results  Component Value Date   ESRSEDRATE 18 06/12/2014          Assessment & Plan:

## 2014-06-12 NOTE — Patient Instructions (Addendum)
prevnar and Tdap today   Ok to take advil up to 800 mg three x daily but take with meals   Please remember to go to the lab department downstairs for your tests - we will call you with the results when they are available.  Please schedule a follow up visit in 6 months but call sooner if needed

## 2014-06-12 NOTE — Progress Notes (Signed)
Quick Note:  Pt aware ______ 

## 2014-06-13 ENCOUNTER — Telehealth: Payer: Self-pay | Admitting: Internal Medicine

## 2014-06-13 ENCOUNTER — Encounter: Payer: Self-pay | Admitting: Internal Medicine

## 2014-06-13 NOTE — Assessment & Plan Note (Signed)
-   Notified by [facility] not refilling zoloft 09/14/2010   And  10/28/2010 > d/c 03/15/2014 > much worse fatigue > resolved p restarted

## 2014-06-13 NOTE — Assessment & Plan Note (Addendum)
Tdap/ prevnar 13 today All gyn per Dr Ulanda Edison

## 2014-06-13 NOTE — Assessment & Plan Note (Signed)
Try max dose nsaids with meals, refer back to Dr Sammuel Hines group prn

## 2014-06-13 NOTE — Telephone Encounter (Signed)
Result Note     Call patient : Studies are unremarkable except LDL too high rec diet/ ex x 3 months then ov with Tammy to repeat it fasting  ---   lmtcb x1

## 2014-06-13 NOTE — Assessment & Plan Note (Signed)
-   target LDL less than 160 based on the absence of  identifiable risk factors.   Lab Results  Component Value Date   CHOL 223* 06/12/2014   HDL 45.80 06/12/2014   LDLCALC 152* 06/12/2014   LDLDIRECT 148.0 02/29/2012   TRIG 126.0 06/12/2014   CHOLHDL 5 06/12/2014     Not ideal but below target for medical rx > diet /ex repeat in 3 m

## 2014-06-13 NOTE — Assessment & Plan Note (Signed)
Bone density 04/25/14 >   LFN/RFN   -1.0   Assured this was not the cause of her joint complaints/ continue ca/ vit d/ wt bearing ex

## 2014-06-13 NOTE — Assessment & Plan Note (Signed)
Flared off ppi 03/15/2014 >restarted rx > resolved symptoms

## 2014-06-13 NOTE — Progress Notes (Signed)
Quick Note:  LMTCB ______ 

## 2014-06-13 NOTE — Assessment & Plan Note (Addendum)
Worse with spring season despite nasonex/ claritin  rec again try zyrtec since not optimal with claritin

## 2014-06-16 NOTE — Telephone Encounter (Signed)
LMTCB x 2  [phone])

## 2014-06-17 NOTE — Telephone Encounter (Signed)
atc pt, line rang several times, line was picked up and promptly hung up.  Wcb.

## 2014-06-18 NOTE — Telephone Encounter (Signed)
lmomtcb x1 

## 2014-07-16 DIAGNOSIS — L509 Urticaria, unspecified: Secondary | ICD-10-CM | POA: Diagnosis not present

## 2014-08-25 ENCOUNTER — Other Ambulatory Visit: Payer: Self-pay | Admitting: Internal Medicine

## 2014-08-25 MED ORDER — PANTOPRAZOLE SODIUM 40 MG PO TBEC: DELAYED_RELEASE_TABLET | ORAL | Status: DC

## 2014-08-25 MED ORDER — SERTRALINE HCL 100 MG PO TABS: 100.0000 mg | ORAL_TABLET | Freq: Every day | ORAL | Status: DC

## 2014-10-23 ENCOUNTER — Other Ambulatory Visit: Payer: Self-pay | Admitting: Internal Medicine

## 2014-11-27 DIAGNOSIS — Z130 Encounter for screening for diseases of the blood and blood-forming organs and certain disorders involving the immune mechanism: Secondary | ICD-10-CM | POA: Diagnosis not present

## 2014-11-27 DIAGNOSIS — Z1231 Encounter for screening mammogram for malignant neoplasm of breast: Secondary | ICD-10-CM | POA: Diagnosis not present

## 2014-11-27 DIAGNOSIS — N8111 Cystocele, midline: Secondary | ICD-10-CM | POA: Diagnosis not present

## 2014-11-27 DIAGNOSIS — Z01419 Encounter for gynecological examination (general) (routine) without abnormal findings: Secondary | ICD-10-CM | POA: Diagnosis not present

## 2014-11-27 DIAGNOSIS — N816 Rectocele: Secondary | ICD-10-CM | POA: Diagnosis not present

## 2014-11-27 DIAGNOSIS — K625 Hemorrhage of anus and rectum: Secondary | ICD-10-CM | POA: Diagnosis not present

## 2014-12-09 ENCOUNTER — Ambulatory Visit: Payer: Managed Care, Other (non HMO) | Admitting: Internal Medicine

## 2014-12-22 ENCOUNTER — Other Ambulatory Visit: Payer: Self-pay | Admitting: Internal Medicine

## 2015-03-23 ENCOUNTER — Encounter: Payer: Self-pay | Admitting: Gastroenterology

## 2015-03-24 DIAGNOSIS — M542 Cervicalgia: Secondary | ICD-10-CM | POA: Diagnosis not present

## 2015-03-24 DIAGNOSIS — M545 Low back pain: Secondary | ICD-10-CM | POA: Diagnosis not present

## 2015-03-30 DIAGNOSIS — M542 Cervicalgia: Secondary | ICD-10-CM | POA: Diagnosis not present

## 2015-03-30 DIAGNOSIS — M545 Low back pain: Secondary | ICD-10-CM | POA: Diagnosis not present

## 2015-04-01 DIAGNOSIS — M542 Cervicalgia: Secondary | ICD-10-CM | POA: Diagnosis not present

## 2015-04-01 DIAGNOSIS — M545 Low back pain: Secondary | ICD-10-CM | POA: Diagnosis not present

## 2015-04-06 DIAGNOSIS — M542 Cervicalgia: Secondary | ICD-10-CM | POA: Diagnosis not present

## 2015-04-06 DIAGNOSIS — M545 Low back pain: Secondary | ICD-10-CM | POA: Diagnosis not present

## 2015-04-08 DIAGNOSIS — M545 Low back pain: Secondary | ICD-10-CM | POA: Diagnosis not present

## 2015-04-08 DIAGNOSIS — M542 Cervicalgia: Secondary | ICD-10-CM | POA: Diagnosis not present

## 2015-04-16 DIAGNOSIS — M47812 Spondylosis without myelopathy or radiculopathy, cervical region: Secondary | ICD-10-CM | POA: Diagnosis not present

## 2015-04-20 DIAGNOSIS — M542 Cervicalgia: Secondary | ICD-10-CM | POA: Diagnosis not present

## 2015-04-20 DIAGNOSIS — M545 Low back pain: Secondary | ICD-10-CM | POA: Diagnosis not present

## 2015-05-05 DIAGNOSIS — M47816 Spondylosis without myelopathy or radiculopathy, lumbar region: Secondary | ICD-10-CM | POA: Diagnosis not present

## 2015-05-18 DIAGNOSIS — M47816 Spondylosis without myelopathy or radiculopathy, lumbar region: Secondary | ICD-10-CM | POA: Diagnosis not present

## 2015-05-28 ENCOUNTER — Telehealth: Payer: Self-pay | Admitting: Internal Medicine

## 2015-05-28 MED ORDER — PANTOPRAZOLE SODIUM 40 MG PO TBEC: DELAYED_RELEASE_TABLET | ORAL | Status: DC

## 2015-05-28 MED ORDER — SERTRALINE HCL 100 MG PO TABS: 100.0000 mg | ORAL_TABLET | Freq: Every day | ORAL | Status: DC

## 2015-05-28 NOTE — Telephone Encounter (Signed)
Pt states she she has obtained new insurance and can now go to [facility] for her medications. She would like to have Protonix and Zoloft 90 day supply. Pt is aware that Rx's have been sent to new pharmacy and nothing more needed at this time.

## 2015-06-04 DIAGNOSIS — M47816 Spondylosis without myelopathy or radiculopathy, lumbar region: Secondary | ICD-10-CM | POA: Diagnosis not present

## 2015-06-23 DIAGNOSIS — M47816 Spondylosis without myelopathy or radiculopathy, lumbar region: Secondary | ICD-10-CM | POA: Diagnosis not present

## 2015-07-15 DIAGNOSIS — L255 Unspecified contact dermatitis due to plants, except food: Secondary | ICD-10-CM | POA: Diagnosis not present

## 2015-08-25 DIAGNOSIS — H25813 Combined forms of age-related cataract, bilateral: Secondary | ICD-10-CM | POA: Diagnosis not present

## 2015-08-25 DIAGNOSIS — H40003 Preglaucoma, unspecified, bilateral: Secondary | ICD-10-CM | POA: Diagnosis not present

## 2015-08-28 ENCOUNTER — Other Ambulatory Visit: Payer: Self-pay | Admitting: Internal Medicine

## 2015-09-01 ENCOUNTER — Telehealth: Payer: Self-pay | Admitting: Internal Medicine

## 2015-09-01 ENCOUNTER — Other Ambulatory Visit: Payer: Self-pay | Admitting: Internal Medicine

## 2015-09-01 MED ORDER — PANTOPRAZOLE SODIUM 40 MG PO TBEC: DELAYED_RELEASE_TABLET | ORAL | Status: DC

## 2015-09-01 NOTE — Telephone Encounter (Signed)
Spoke with pt and she is requesting refill on Protonix. She states that last refill request was denied. I do not see any refill requests denied in pt's chart. Advised pt that if it was denied was likely because we have not seen pt in office since 4/16 and she was advised to make 82mo f/u at that time. Pt states that if we can refill she will call back to make appt. Rx sent for with no refills pending pt making f/u appt with MW. Nothing further needed.

## 2015-09-03 DIAGNOSIS — H401111 Primary open-angle glaucoma, right eye, mild stage: Secondary | ICD-10-CM | POA: Diagnosis not present

## 2015-09-03 DIAGNOSIS — H40002 Preglaucoma, unspecified, left eye: Secondary | ICD-10-CM | POA: Diagnosis not present

## 2015-09-19 ENCOUNTER — Other Ambulatory Visit: Payer: Self-pay | Admitting: Internal Medicine

## 2015-10-05 DIAGNOSIS — Z961 Presence of intraocular lens: Secondary | ICD-10-CM | POA: Diagnosis not present

## 2015-10-05 DIAGNOSIS — H25812 Combined forms of age-related cataract, left eye: Secondary | ICD-10-CM | POA: Diagnosis not present

## 2015-10-05 DIAGNOSIS — H2512 Age-related nuclear cataract, left eye: Secondary | ICD-10-CM | POA: Diagnosis not present

## 2015-11-16 DIAGNOSIS — Z961 Presence of intraocular lens: Secondary | ICD-10-CM | POA: Diagnosis not present

## 2015-11-16 DIAGNOSIS — H25811 Combined forms of age-related cataract, right eye: Secondary | ICD-10-CM | POA: Diagnosis not present

## 2015-11-16 DIAGNOSIS — H2511 Age-related nuclear cataract, right eye: Secondary | ICD-10-CM | POA: Diagnosis not present

## 2015-11-16 DIAGNOSIS — H401111 Primary open-angle glaucoma, right eye, mild stage: Secondary | ICD-10-CM | POA: Diagnosis not present

## 2015-11-30 DIAGNOSIS — N904 Leukoplakia of vulva: Secondary | ICD-10-CM | POA: Diagnosis not present

## 2015-11-30 DIAGNOSIS — Z1389 Encounter for screening for other disorder: Secondary | ICD-10-CM | POA: Diagnosis not present

## 2015-11-30 DIAGNOSIS — N8110 Cystocele, unspecified: Secondary | ICD-10-CM | POA: Diagnosis not present

## 2015-11-30 DIAGNOSIS — Z01419 Encounter for gynecological examination (general) (routine) without abnormal findings: Secondary | ICD-10-CM | POA: Diagnosis not present

## 2015-11-30 DIAGNOSIS — Z1231 Encounter for screening mammogram for malignant neoplasm of breast: Secondary | ICD-10-CM | POA: Diagnosis not present

## 2015-11-30 DIAGNOSIS — Z130 Encounter for screening for diseases of the blood and blood-forming organs and certain disorders involving the immune mechanism: Secondary | ICD-10-CM | POA: Diagnosis not present

## 2015-11-30 DIAGNOSIS — N816 Rectocele: Secondary | ICD-10-CM | POA: Diagnosis not present

## 2015-12-07 ENCOUNTER — Other Ambulatory Visit: Payer: Self-pay | Admitting: Internal Medicine

## 2015-12-08 ENCOUNTER — Telehealth: Payer: Self-pay | Admitting: Internal Medicine

## 2015-12-08 MED ORDER — PANTOPRAZOLE SODIUM 40 MG PO TBEC: DELAYED_RELEASE_TABLET | ORAL | 0 refills | Status: DC

## 2015-12-08 NOTE — Telephone Encounter (Signed)
Spoke with the pt  I have scheduled her for rov since she is overdue and rx for protonix was sent

## 2015-12-11 ENCOUNTER — Ambulatory Visit (INDEPENDENT_AMBULATORY_CARE_PROVIDER_SITE_OTHER)
Admission: RE | Admit: 2015-12-11 | Discharge: 2015-12-11 | Disposition: A | Discharge status: Home / Self Care | Payer: PPO | Source: Ambulatory Visit | Attending: Internal Medicine | Admitting: Internal Medicine

## 2015-12-11 ENCOUNTER — Other Ambulatory Visit (INDEPENDENT_AMBULATORY_CARE_PROVIDER_SITE_OTHER): Payer: PPO

## 2015-12-11 ENCOUNTER — Encounter: Payer: Self-pay | Admitting: Internal Medicine

## 2015-12-11 ENCOUNTER — Ambulatory Visit (INDEPENDENT_AMBULATORY_CARE_PROVIDER_SITE_OTHER): Payer: PPO | Admitting: Internal Medicine

## 2015-12-11 VITALS — BP 166/80 | HR 63 | Ht 67.75 in | Wt 197.6 lb

## 2015-12-11 DIAGNOSIS — J302 Other seasonal allergic rhinitis: Secondary | ICD-10-CM | POA: Diagnosis not present

## 2015-12-11 DIAGNOSIS — I517 Cardiomegaly: Secondary | ICD-10-CM | POA: Diagnosis not present

## 2015-12-11 DIAGNOSIS — M25559 Pain in unspecified hip: Secondary | ICD-10-CM

## 2015-12-11 DIAGNOSIS — I10 Essential (primary) hypertension: Secondary | ICD-10-CM

## 2015-12-11 DIAGNOSIS — E785 Hyperlipidemia, unspecified: Secondary | ICD-10-CM

## 2015-12-11 LAB — HEPATIC FUNCTION PANEL
ALBUMIN: 4 g/dL (ref 3.5–5.2)
ALK PHOS: 67 U/L (ref 39–117)
ALT: 10 U/L (ref 0–35)
AST: 11 U/L (ref 0–37)
Bilirubin, Direct: 0.1 mg/dL (ref 0.0–0.3)
Total Bilirubin: 0.5 mg/dL (ref 0.2–1.2)
Total Protein: 6.7 g/dL (ref 6.0–8.3)

## 2015-12-11 LAB — BASIC METABOLIC PANEL
BUN: 19 mg/dL (ref 6–23)
CALCIUM: 9.4 mg/dL (ref 8.4–10.5)
CHLORIDE: 106 meq/L (ref 96–112)
CO2: 32 mEq/L (ref 19–32)
CREATININE: 0.87 mg/dL (ref 0.40–1.20)
GFR: 68.95 mL/min (ref >60.00)
Glucose, Bld: 88 mg/dL (ref 70–99)
Potassium: 3.6 mEq/L (ref 3.5–5.1)
Sodium: 144 mEq/L (ref 135–145)

## 2015-12-11 LAB — CBC WITH DIFFERENTIAL/PLATELET
BASOS PCT: 0.5 % (ref 0.0–3.0)
Basophils Absolute: 0 10*3/uL (ref 0.0–0.1)
EOS ABS: 0.2 10*3/uL (ref 0.0–0.7)
EOS PCT: 2.3 % (ref 0.0–5.0)
HCT: 43.6 % (ref 36.0–46.0)
HEMOGLOBIN: 14.9 g/dL (ref 12.0–15.0)
LYMPHS ABS: 2.2 10*3/uL (ref 0.7–4.0)
Lymphocytes Relative: 24.6 % (ref 12.0–46.0)
MCHC: 34.1 g/dL (ref 30.0–36.0)
MCV: 90.1 fl (ref 78.0–100.0)
MONO ABS: 0.7 10*3/uL (ref 0.1–1.0)
Monocytes Relative: 7.8 % (ref 3.0–12.0)
NEUTROS PCT: 64.8 % (ref 43.0–77.0)
Neutro Abs: 5.8 10*3/uL (ref 1.4–7.7)
Platelets: 270 10*3/uL (ref 150.0–400.0)
RBC: 4.84 Mil/uL (ref 3.87–5.11)
RDW: 12.4 % (ref 11.5–15.5)
WBC: 9 10*3/uL (ref 4.0–10.5)

## 2015-12-11 LAB — URINALYSIS, ROUTINE W REFLEX MICROSCOPIC
Bilirubin Urine: NEGATIVE
Hgb urine dipstick: NEGATIVE
Ketones, ur: NEGATIVE
Nitrite: NEGATIVE
PH: 6 (ref 5.0–8.0)
SPECIFIC GRAVITY, URINE: 1.02 (ref 1.000–1.030)
Total Protein, Urine: NEGATIVE
UROBILINOGEN UA: 1 (ref 0.0–1.0)
Urine Glucose: NEGATIVE

## 2015-12-11 LAB — LIPID PANEL
CHOLESTEROL: 214 mg/dL — AB (ref 0–200)
HDL: 47.9 mg/dL (ref >39.00)
LDL Cholesterol: 146 mg/dL — ABNORMAL HIGH (ref 0–99)
NONHDL: 166.32
Total CHOL/HDL Ratio: 4
Triglycerides: 103 mg/dL (ref 0.0–149.0)
VLDL: 20.6 mg/dL (ref 0.0–40.0)

## 2015-12-11 NOTE — Progress Notes (Signed)
Spoke with pt and notified of results per Dr. Wert. Pt verbalized understanding and denied any questions. 

## 2015-12-11 NOTE — Progress Notes (Signed)
Subjective:    Patient ID: Savannah [name], female    DOB: 08-13-1950    MRN: [id number]    Brief patient profile:  16 yowf never smoker with moderated obesity and tendency to cyclical coughing and both chronic and seasonal rhinitis.       History of Present Illness   03/14/2014 f/u ov/Savannah [name] re: obesity/ chronic rhinitis and GERD with tendency to cyclical coughing  Chief Complaint  Patient presents with  . Follow-up    pt here for med refills. She is still having allergy irritations cough, sneezing etc.  cough def worse off ppi, wants to try off zoloft   / claritin helps sneezing itching but not cough which has waxed and waned x sev years day > noct  rec Depomedrol 120 mg IM For itching sneezing > try zyrtec      06/12/2014 f/u ov/Savannah [name] re:  cpx  Re djd/ hyperlipidemia/ gerd/ fatigue resp to zoloft Chief Complaint  Patient presents with  . Follow-up    3 month follow-up- pt states she is here for yearly physical.  Pt c/o bone pain, no other complaints.     Much worse overall off zolft so restarted/ arthralgias better with nsaids Did not try zyrtec for rhinitits which is "no worse than usual this time of year" rec Naprosyn    12/11/2015  f/u ov/Savannah [name] re:  DJD back/ hyperlipidemia/ rhinitis / hbp on nsaids  Chief Complaint  Patient presents with  . Follow-up    Pt fasting today. She states overall doing well besides "back issues"- for the past 8 days.   over using nsaids for back pain/ uses salt/ no ex due to back pain   No obvious day to day or daytime variabilty or assoc sob  or cp or chest tightness, subjective wheeze overt   hb symptoms. No unusual exp hx or h/o childhood pna/ asthma or knowledge of premature birth.  Sleeping ok without nocturnal  or early am exacerbation  of respiratory  c/o's or need for noct saba. Also denies any obvious fluctuation of symptoms with weather or environmental changes or other aggravating or alleviating factors except as outlined above    Current Medications, Allergies, Complete Past Medical History, Past Surgical History, Family History, and Social History were reviewed in [facility] record.  ROS  The following are not active complaints unless bolded sore throat, dysphagia, dental problems, itching, sneezing,  nasal congestion or excess/ purulent secretions, ear ache,   fever, chills, sweats, unintended wt loss, pleuritic or exertional cp, hemoptysis,  orthopnea pnd or leg swelling, presyncope, palpitations, heartburn, abdominal pain, anorexia, nausea, vomiting, diarrhea  or change in bowel or urinary habits, change in stools or urine, dysuria,hematuria,  rash, arthralgias, visual complaints, headache, numbness weakness or ataxia or problems with walking or coordination,  change in mood/affect or memory.             Past Medical History:  Obesity with an ideal weight less than 171, target less than 190 to get BMI < 30  Nonspecific rhinitis  - CRF given 04/06/04  - CRF repeated November 10, 123XX123    Cyclical cough felt to be partially reflux in the past; see  Endoscopy January 2001 consistent with a small hiatal hernia.    Hiatal hernia. Pos egd 1/01................................................................Marland KitchenStark      Diverticulosis pos colonoscopy 02/1999............................................Marland KitchenStark   Irritable bowel syndrome.    Hyperlipidemia, target LDL less than 160 based on the absence of  identifiable risk factors.  Headaches (see most recent office visit April 03, 2006),  completely resolved with Midrin and does not require daily dosing.  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 Nl external ear canals without cough reflex   NECK :  without JVD/Nodes/TM/ nl carotid upstrokes bilaterally   LUNGS: no acc muscle use, clear to A and P bilaterally without cough on insp or exp maneuvers   CV:  RRR  no s3 or murmur or increase in P2, no edema   ABD:  soft and nontender with nl excursion in the supine position. No bruits or organomegaly, bowel sounds nl  MS:  warm without deformities, calf tenderness, cyanosis or clubbing No joint restrictions or deformities   Breasts  No masses or nodes   SKIN: warm and dry without lesions    NEURO:  alert, approp, no deficits    GU/ Rectal per Dr Judeth Horn ordered/ reviewed:      Chemistry      Component Value Date/Time   NA 144 12/11/2015 0916   K 3.6 12/11/2015 0916   CL 106 12/11/2015 0916   CO2 32 12/11/2015 0916   BUN 19 12/11/2015 0916   CREATININE 0.87 12/11/2015 0916      Component Value Date/Time   CALCIUM 9.4 12/11/2015 0916   ALKPHOS 67 12/11/2015 0916   AST 11 12/11/2015 0916   ALT 10 12/11/2015 0916   BILITOT 0.5 12/11/2015 0916        Lab Results  Component Value Date   WBC 9.0 12/11/2015   HGB 14.9 12/11/2015   HCT 43.6 12/11/2015   MCV 90.1 12/11/2015   PLT 270.0 12/11/2015        Lab Results  Component Value Date   TSH 1.70 12/11/2015            Assessment & Plan:

## 2015-12-11 NOTE — Patient Instructions (Addendum)
Stop naprosyn and avoid salt and any decongestants/ avoid excess caffeine   For arthritis as needed try clinoril 200 mg every 12 hours with meals and ok to use tylenol and only tylenol with it   Please remember to go to the lab and x-ray department downstairs for your tests - we will call you with the results when they are available.    Please schedule a follow up office visit in 4 weeks, sooner if needed

## 2015-12-14 LAB — TSH: TSH: 1.7 u[IU]/mL (ref 0.35–4.50)

## 2015-12-15 ENCOUNTER — Encounter: Payer: Self-pay | Admitting: Internal Medicine

## 2015-12-15 ENCOUNTER — Telehealth: Payer: Self-pay | Admitting: Internal Medicine

## 2015-12-15 NOTE — Assessment & Plan Note (Signed)
-   target LDL less than 160 based on the absence of  identifiable risk factors other than borderline hbp  Lab Results  Component Value Date   CHOL 214 (H) 12/11/2015   HDL 47.90 12/11/2015   LDLCALC 146 (H) 12/11/2015   LDLDIRECT 148.0 02/29/2012   TRIG 103.0 12/11/2015   CHOLHDL 4 12/11/2015

## 2015-12-15 NOTE — Telephone Encounter (Signed)
Notes Recorded by Tanda Rockers, MD on 12/14/2015 at 5:40 PM EDT Call patient : Studies are unremarkable, no change in recs  Pt aware of results and voiced understanding. Nothing further needed.

## 2015-12-15 NOTE — Assessment & Plan Note (Signed)
Adequate control on present rx, reviewed > no change in rx needed   

## 2015-12-15 NOTE — Progress Notes (Signed)
LMTCB

## 2015-12-15 NOTE — Assessment & Plan Note (Signed)
Try off naprosyn/ salt/ recheck in 3 m

## 2015-12-15 NOTE — Assessment & Plan Note (Signed)
Advised change naprosyn to clinoril which spares renal prostaglandins and does not typically cause NaCl retention

## 2015-12-30 DIAGNOSIS — N904 Leukoplakia of vulva: Secondary | ICD-10-CM | POA: Diagnosis not present

## 2016-01-04 ENCOUNTER — Other Ambulatory Visit: Payer: Self-pay | Admitting: Internal Medicine

## 2016-01-11 ENCOUNTER — Ambulatory Visit (INDEPENDENT_AMBULATORY_CARE_PROVIDER_SITE_OTHER): Payer: PPO | Admitting: Internal Medicine

## 2016-01-11 ENCOUNTER — Encounter: Payer: Self-pay | Admitting: Internal Medicine

## 2016-01-11 VITALS — BP 124/64 | HR 68 | Ht 67.75 in | Wt 200.2 lb

## 2016-01-11 DIAGNOSIS — I10 Essential (primary) hypertension: Secondary | ICD-10-CM

## 2016-01-11 DIAGNOSIS — M25559 Pain in unspecified hip: Secondary | ICD-10-CM

## 2016-01-11 MED ORDER — SULINDAC 200 MG PO TABS: ORAL_TABLET | ORAL | 11 refills | Status: DC

## 2016-01-11 NOTE — Progress Notes (Signed)
Subjective:    Patient ID: Savannah [name], female    DOB: [birth year]    MRN: [id number]    Brief patient profile:  49 yowf never smoker with moderated obesity and tendency to cyclical coughing and both chronic and seasonal rhinitis.       History of Present Illness   03/14/2014 f/u ov/Gaynel Schaafsma re: obesity/ chronic rhinitis and GERD with tendency to cyclical coughing  Chief Complaint  Patient presents with  . Follow-up    pt here for med refills. She is still having allergy irritations cough, sneezing etc.  cough def worse off ppi, wants to try off zoloft   / claritin helps sneezing itching but not cough which has waxed and waned x sev years day > noct  rec Depomedrol 120 mg IM For itching sneezing > try zyrtec      06/12/2014 f/u ov/Leshay Desaulniers re:  cpx  Re djd/ hyperlipidemia/ gerd/ fatigue resp to zoloft Chief Complaint  Patient presents with  . Follow-up    3 month follow-up- pt states she is here for yearly physical.  Pt c/o bone pain, no other complaints.     Much worse overall off zolft so restarted/ arthralgias better with nsaids Did not try zyrtec for rhinitits which is "no worse than usual this time of year" rec Naprosyn      12/11/2015  f/u ov/Geraldene Eisel re:  DJD back/ hyperlipidemia/ rhinitis / hbp on nsaids  Chief Complaint  Patient presents with  . Follow-up    Pt fasting today. She states overall doing well besides "back issues"- for the past 8 days.   over using nsaids for back pain/ uses salt/ no ex due to back pain  rec Stop naprosyn and avoid salt and any decongestants/ avoid excess caffeine  For arthritis as needed try clinoril 200 mg every 12 hours with meals and ok to use tylenol and only tylenol with it  Please remember to go to the lab and x-ray department downstairs for your tests - we will call you with the results when they are available.   01/11/2016  f/u ov/Sumayyah Custodio re:  hbp Chief Complaint  Patient presents with  . Follow-up    Back pain has improved. She is  here to f/u on elevated BP.    no regular ex at all but not limited, no am ha tia or claudication   No obvious day to day or daytime variabilty or assoc sob  or cp or chest tightness, subjective wheeze overt   hb symptoms. No unusual exp hx or h/o childhood pna/ asthma or knowledge of premature birth.  Sleeping ok without nocturnal  or early am exacerbation  of respiratory  c/o's or need for noct saba. Also denies any obvious fluctuation of symptoms with weather or environmental changes or other aggravating or alleviating factors except as outlined above   Current Medications, Allergies, Complete Past Medical History, Past Surgical History, Family History, and Social History were reviewed in [facility] record.  ROS  The following are not active complaints unless bolded sore throat, dysphagia, dental problems, itching, sneezing,  nasal congestion or excess/ purulent secretions, ear ache,   fever, chills, sweats, unintended wt loss, pleuritic or exertional cp, hemoptysis,  orthopnea pnd or leg swelling, presyncope, palpitations, heartburn, abdominal pain, anorexia, nausea, vomiting, diarrhea  or change in bowel or urinary habits, change in stools or urine, dysuria,hematuria,  rash, arthralgias, visual complaints, headache, numbness weakness or ataxia or problems with walking or coordination,  change in  mood/affect or memory.             Past Medical History:  Obesity with an ideal weight less than 171, target less than 190 to get BMI < 30  Nonspecific rhinitis  - CRF given 04/06/04  - CRF repeated November 10, 123XX123    Cyclical cough felt to be partially reflux in the past; see  Endoscopy January 2001 consistent with a small hiatal hernia.    Hiatal hernia. Pos egd 1/01................................................................Marland KitchenStark      Diverticulosis pos colonoscopy 02/1999............................................Marland KitchenStark   Irritable bowel syndrome.     Hyperlipidemia, target LDL less than 160 based on the absence of  identifiable risk factors.  Headaches (see most recent office visit April 03, 2006),  completely resolved with Midrin and does not require daily dosing.  HEALTH MAINTENANCE....................................................................  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 Nl external ear canals without cough reflex   NECK :  without JVD/Nodes/TM/ nl carotid upstrokes bilaterally   LUNGS: no acc muscle use, clear to A and P bilaterally without cough on insp or exp maneuvers   CV:  RRR  no s3 or murmur or increase in P2, no edema   ABD:  soft and nontender with nl excursion in the supine position. No bruits or organomegaly, bowel sounds nl  MS:  warm without deformities, calf tenderness, cyanosis or clubbing No joint restrictions or deformities   Breasts  No masses or nodes   SKIN: warm and dry without lesions    NEURO:  alert, approp, no deficits        Lab Results  Component Value Date   CREATININE 0.87 12/11/2015   CREATININE 0.74 06/12/2014   CREATININE 1.0 02/29/2012                Assessment & Plan:

## 2016-01-11 NOTE — Patient Instructions (Addendum)
Continue off  naprosyn and avoid salt and any decongestants/ avoid excess caffeine   For arthritis/joint or back pain ok to use as needed try clinoril 200 mg every 12 hours with meals and ok to use tylenol and only tylenol with it if still need something stronger        Please schedule a follow up office visit in 6 months, sooner if needed

## 2016-01-13 NOTE — Assessment & Plan Note (Signed)
Adequate control on present rx, reviewed in detail with pt > no change in rx needed  But continue off nsaids x probably ok to use prn clinoril which spares renal prostaglandins

## 2016-01-13 NOTE — Assessment & Plan Note (Signed)
rx  Clinoril 200 mg bid prn

## 2016-01-29 DIAGNOSIS — N904 Leukoplakia of vulva: Secondary | ICD-10-CM | POA: Diagnosis not present

## 2016-02-15 HISTORY — PX: CATARACT EXTRACTION, BILATERAL: SHX1313

## 2016-03-05 ENCOUNTER — Other Ambulatory Visit: Payer: Self-pay | Admitting: Internal Medicine

## 2016-03-08 DIAGNOSIS — H40002 Preglaucoma, unspecified, left eye: Secondary | ICD-10-CM | POA: Diagnosis not present

## 2016-03-08 DIAGNOSIS — H401111 Primary open-angle glaucoma, right eye, mild stage: Secondary | ICD-10-CM | POA: Diagnosis not present

## 2016-04-27 DIAGNOSIS — K1321 Leukoplakia of oral mucosa, including tongue: Secondary | ICD-10-CM | POA: Diagnosis not present

## 2016-04-27 DIAGNOSIS — Z130 Encounter for screening for diseases of the blood and blood-forming organs and certain disorders involving the immune mechanism: Secondary | ICD-10-CM | POA: Diagnosis not present

## 2016-04-27 DIAGNOSIS — Z1389 Encounter for screening for other disorder: Secondary | ICD-10-CM | POA: Diagnosis not present

## 2016-04-27 DIAGNOSIS — N816 Rectocele: Secondary | ICD-10-CM | POA: Diagnosis not present

## 2016-04-27 DIAGNOSIS — N8110 Cystocele, unspecified: Secondary | ICD-10-CM | POA: Diagnosis not present

## 2016-04-27 DIAGNOSIS — N904 Leukoplakia of vulva: Secondary | ICD-10-CM | POA: Diagnosis not present

## 2016-04-28 ENCOUNTER — Other Ambulatory Visit: Payer: Self-pay | Admitting: Internal Medicine

## 2016-09-01 ENCOUNTER — Other Ambulatory Visit: Payer: Self-pay | Admitting: Internal Medicine

## 2016-09-13 DIAGNOSIS — L255 Unspecified contact dermatitis due to plants, except food: Secondary | ICD-10-CM | POA: Diagnosis not present

## 2016-10-03 ENCOUNTER — Other Ambulatory Visit: Payer: Self-pay | Admitting: Internal Medicine

## 2016-10-04 ENCOUNTER — Encounter: Payer: Self-pay | Admitting: Internal Medicine

## 2016-10-04 ENCOUNTER — Ambulatory Visit (INDEPENDENT_AMBULATORY_CARE_PROVIDER_SITE_OTHER): Payer: PPO | Admitting: Internal Medicine

## 2016-10-04 VITALS — BP 114/60 | HR 65 | Ht 69.0 in | Wt 163.0 lb

## 2016-10-04 DIAGNOSIS — Z0000 Encounter for general adult medical examination without abnormal findings: Secondary | ICD-10-CM

## 2016-10-04 DIAGNOSIS — M25559 Pain in unspecified hip: Secondary | ICD-10-CM

## 2016-10-04 NOTE — Patient Instructions (Addendum)
You will need to establish with [facility] and see me as needed for respiratory problems   Go ahead and schedule your colonoscopy per Dr Fuller Plan

## 2016-10-04 NOTE — Progress Notes (Signed)
Subjective:    Patient ID: Savannah [name], female    DOB: Nov 21, 1950    MRN: [id number]    Brief patient profile:  22 yowf never smoker with moderated obesity and tendency to cyclical coughing and both chronic and seasonal rhinitis.       History of Present Illness   03/14/2014 f/u ov/Wert re: obesity/ chronic rhinitis and GERD with tendency to cyclical coughing  Chief Complaint  Patient presents with  . Follow-up    pt here for med refills. She is still having allergy irritations cough, sneezing etc.  cough def worse off ppi, wants to try off zoloft   / claritin helps sneezing itching but not cough which has waxed and waned x sev years day > noct  rec Depomedrol 120 mg IM For itching sneezing > try zyrtec       12/11/2015  f/u ov/Wert re:  DJD back/ hyperlipidemia/ rhinitis / hbp on nsaids  Chief Complaint  Patient presents with  . Follow-up    Pt fasting today. She states overall doing well besides "back issues"- for the past 8 days.   over using nsaids for back pain/ uses salt/ no ex due to back pain  rec Stop naprosyn and avoid salt and any decongestants/ avoid excess caffeine  For arthritis as needed try clinoril 200 mg every 12 hours with meals and ok to use tylenol and only tylenol with it  Please remember to go to the lab and x-ray department downstairs for your tests - we will call you with the results when they are available.   01/11/2016  f/u ov/Wert re:  hbp Chief Complaint  Patient presents with  . Follow-up    Back pain has improved. She is here to f/u on elevated BP.    no regular ex at all but not limited, no am ha tia or claudication rec Continue off  naprosyn and avoid salt and any decongestants/ avoid excess caffeine   For arthritis/joint or back pain ok to use as needed try clinoril 200 mg every 12 hours with meals and ok to use tylenol and only tylenol with it if still need something stronger     10/04/2016  f/u ov/Wert re:  Hbp / djd  Chief  Complaint  Patient presents with  . Follow-up    Doing well and no new co's today.   clinioril helping the back but needing it 4 x weekly bid on avg/ no radicular features  Not limited by breathing from desired activities  Or ex cp/ claudication   No obvious day to day or daytime variability or assoc excess/ purulent sputum or mucus plugs or hemoptysis or cp or chest tightness, subjective wheeze or overt sinus or hb symptoms. No unusual exp hx or h/o childhood pna/ asthma or knowledge of premature birth.  Sleeping ok without nocturnal  or early am exacerbation  of respiratory  c/o's or need for noct saba. Also denies any obvious fluctuation of symptoms with weather or environmental changes or other aggravating or alleviating factors except as outlined above   Current Medications, Allergies, Complete Past Medical History, Past Surgical History, Family History, and Social History were reviewed in [facility] record.  ROS  The following are not active complaints unless bolded sore throat, dysphagia, dental problems, itching, sneezing,  nasal congestion or excess/ purulent secretions, ear ache,   fever, chills, sweats, unintended wt loss, classically pleuritic or exertional cp,  orthopnea pnd or leg swelling, presyncope, palpitations, abdominal pain,  anorexia, nausea, vomiting, diarrhea  or change in bowel or bladder habits, change in stools or urine, dysuria,hematuria,  rash, arthralgias, visual complaints, headache, numbness, weakness or ataxia or problems with walking or coordination,  change in mood/affect or memory.                          Past Medical History:  Obesity with an ideal weight less than 171, target less than 190 to get BMI < 30  Nonspecific rhinitis  - CRF given 04/06/04  - CRF repeated December 24, 2829    Cyclical cough felt to be partially reflux in the past; see  Endoscopy January 2001 consistent with a small hiatal hernia.    Hiatal hernia.  Pos egd 1/01................................................................Marland KitchenStark      Diverticulosis pos colonoscopy 02/1999............................................Marland KitchenStark   Irritable bowel syndrome.    Hyperlipidemia, target LDL less than 160 based on the absence of  identifiable risk factors.  Headaches (see most recent office visit April 03, 2006),  completely resolved with Midrin and does not require daily dosing.  HEALTH MAINTENANCE....................................................................   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 Nl external ear canals without cough reflex   NECK :  without JVD/Nodes/TM/ nl carotid upstrokes bilaterally   LUNGS: no acc muscle use, clear to A and P bilaterally without cough on insp or exp maneuvers   CV:  RRR  no s3 or murmur or increase in P2, no edema   ABD:  soft and nontender with nl excursion in the supine position. No bruits or organomegaly, bowel sounds nl  MS:  warm without deformities, calf tenderness, cyanosis or clubbing No joint restrictions or deformities   Breasts  No masses or nodes   SKIN: warm and dry without lesions    NEURO:  alert, approp, no deficits                 Assessment & Plan:

## 2016-10-05 ENCOUNTER — Encounter: Payer: Self-pay | Admitting: Internal Medicine

## 2016-10-05 NOTE — Assessment & Plan Note (Signed)
Appears to be controlled with just clinoril prn > f/u ortho prn, avoid other nsaids due to tendency to fluid retention and hbp

## 2016-10-05 NOTE — Assessment & Plan Note (Signed)
Reviewed need to establish with her PCP of record, Dr Alain Marion, per insurance directive and f/u in pulmonary clinic prn  In meantime should contact Dr Lynne Leader office re colonoscopy recs

## 2016-11-01 ENCOUNTER — Other Ambulatory Visit: Payer: Self-pay | Admitting: Internal Medicine

## 2017-02-01 ENCOUNTER — Other Ambulatory Visit: Payer: Self-pay | Admitting: Internal Medicine

## 2017-02-28 ENCOUNTER — Other Ambulatory Visit (INDEPENDENT_AMBULATORY_CARE_PROVIDER_SITE_OTHER): Payer: PPO

## 2017-02-28 ENCOUNTER — Encounter: Payer: Self-pay | Admitting: Internal Medicine

## 2017-02-28 ENCOUNTER — Ambulatory Visit (INDEPENDENT_AMBULATORY_CARE_PROVIDER_SITE_OTHER): Payer: PPO | Admitting: Internal Medicine

## 2017-02-28 ENCOUNTER — Encounter: Payer: Self-pay | Admitting: Gastroenterology

## 2017-02-28 VITALS — BP 150/92 | HR 68 | Temp 98.1°F | Ht 69.0 in | Wt 159.0 lb

## 2017-02-28 DIAGNOSIS — Z0000 Encounter for general adult medical examination without abnormal findings: Secondary | ICD-10-CM

## 2017-02-28 DIAGNOSIS — Z1159 Encounter for screening for other viral diseases: Secondary | ICD-10-CM

## 2017-02-28 DIAGNOSIS — Z23 Encounter for immunization: Secondary | ICD-10-CM

## 2017-02-28 LAB — BASIC METABOLIC PANEL
BUN: 19 mg/dL (ref 6–23)
CHLORIDE: 101 meq/L (ref 96–112)
CO2: 29 meq/L (ref 19–32)
Calcium: 9.3 mg/dL (ref 8.4–10.5)
Creatinine, Ser: 0.77 mg/dL (ref 0.40–1.20)
GFR: 79.09 mL/min (ref >60.00)
GLUCOSE: 92 mg/dL (ref 70–99)
POTASSIUM: 3.6 meq/L (ref 3.5–5.1)
Sodium: 136 mEq/L (ref 135–145)

## 2017-02-28 LAB — CBC WITH DIFFERENTIAL/PLATELET
BASOS ABS: 0 10*3/uL (ref 0.0–0.1)
BASOS PCT: 0.5 % (ref 0.0–3.0)
EOS ABS: 0.1 10*3/uL (ref 0.0–0.7)
Eosinophils Relative: 0.9 % (ref 0.0–5.0)
HEMATOCRIT: 40.2 % (ref 36.0–46.0)
HEMOGLOBIN: 13.2 g/dL (ref 12.0–15.0)
LYMPHS PCT: 22.7 % (ref 12.0–46.0)
Lymphs Abs: 2.1 10*3/uL (ref 0.7–4.0)
MCHC: 33 g/dL (ref 30.0–36.0)
MCV: 93 fl (ref 78.0–100.0)
Monocytes Absolute: 0.7 10*3/uL (ref 0.1–1.0)
Monocytes Relative: 8 % (ref 3.0–12.0)
Neutro Abs: 6.2 10*3/uL (ref 1.4–7.7)
Neutrophils Relative %: 67.9 % (ref 43.0–77.0)
Platelets: 246 10*3/uL (ref 150.0–400.0)
RBC: 4.32 Mil/uL (ref 3.87–5.11)
RDW: 12.4 % (ref 11.5–15.5)
WBC: 9.2 10*3/uL (ref 4.0–10.5)

## 2017-02-28 LAB — TSH: TSH: 1.39 u[IU]/mL (ref 0.35–4.50)

## 2017-02-28 LAB — HEPATIC FUNCTION PANEL
ALBUMIN: 4.1 g/dL (ref 3.5–5.2)
ALT: 10 U/L (ref 0–35)
AST: 15 U/L (ref 0–37)
Alkaline Phosphatase: 73 U/L (ref 39–117)
BILIRUBIN TOTAL: 0.4 mg/dL (ref 0.2–1.2)
Bilirubin, Direct: 0 mg/dL (ref 0.0–0.3)
Total Protein: 6.7 g/dL (ref 6.0–8.3)

## 2017-02-28 LAB — URINALYSIS, ROUTINE W REFLEX MICROSCOPIC
Bilirubin Urine: NEGATIVE
HGB URINE DIPSTICK: NEGATIVE
Ketones, ur: NEGATIVE
NITRITE: NEGATIVE
PH: 6 (ref 5.0–8.0)
RBC / HPF: NONE SEEN (ref 0–?)
Specific Gravity, Urine: 1.015 (ref 1.000–1.030)
TOTAL PROTEIN, URINE-UPE24: NEGATIVE
URINE GLUCOSE: NEGATIVE
Urobilinogen, UA: 0.2 (ref 0.0–1.0)

## 2017-02-28 LAB — LIPID PANEL
Cholesterol: 180 mg/dL (ref 0–200)
HDL: 47 mg/dL (ref >39.00)
LDL CALC: 104 mg/dL — AB (ref 0–99)
NONHDL: 133.29
Total CHOL/HDL Ratio: 4
Triglycerides: 146 mg/dL (ref 0.0–149.0)
VLDL: 29.2 mg/dL (ref 0.0–40.0)

## 2017-02-28 NOTE — Patient Instructions (Addendum)
Please schedule the bone density test before leaving today at the scheduling desk (where you check out)  You had the Pneumovax pneumonia shot  You will be contacted regarding the referral for: colonoscopy  Please continue all other medications as before, and refills have been done if requested.  Please have the pharmacy call with any other refills you may need.  Please continue your efforts at being more active, low cholesterol diet, and weight control.  You are otherwise up to date with prevention measures today.  Please keep your appointments with your specialists as you may have planned  Please go to the LAB in the Basement (turn left off the elevator) for the tests to be done today  You will be contacted by phone if any changes need to be made immediately.  Otherwise, you will receive a letter about your results with an explanation, but please check with MyChart first.  Please remember to sign up for MyChart if you have not done so, as this will be important to you in the future with finding out test results, communicating by private email, and scheduling acute appointments online when needed.  Please return in 1 year for your yearly visit, or sooner if needed, with Lab testing done 3-5 days before

## 2017-02-28 NOTE — Progress Notes (Signed)
Subjective:    Patient ID: Savannah [name], female    DOB: Dec 28, 1950, 67 y.o.   MRN: [id number]  HPI  Here for wellness and f/u;  Overall doing ok;  Pt denies Chest pain, worsening SOB, DOE, wheezing, orthopnea, PND, worsening LE edema, palpitations, dizziness or syncope.  Pt denies neurological change such as new headache, facial or extremity weakness.  Pt denies polydipsia, polyuria, or low sugar symptoms. Pt states overall good compliance with treatment and medications, good tolerability, and has been trying to follow appropriate diet.  Pt denies worsening depressive symptoms, suicidal ideation or panic. No fever, night sweats, wt loss, loss of appetite, or other constitutional symptoms.  Pt states good ability with ADL's, has low fall risk, home safety reviewed and adequate, no other significant changes in hearing or vision, and only occasionally active with exercise.  Peak wt has been 210, now down with better diet, not really active except for gardening in the summer due to chronic LBP>  Pt continues to have recurring LBP without change in severity, bowel or bladder change, fever, wt loss,  worsening LE pain/numbness/weakness, gait change or falls, but does have chronic recurring leftl lower back with radiation to the left groin.  Declines flu shot today, [facility]  Plans to make appt with [facility] to have exam and mammogram/Dr Henley.  No other exam findings or interval hx  States BP < 140/90 at home Past Medical History:  Diagnosis Date  . Cough   . Diverticulosis   . Headache(784.0)   . Healthcare maintenance   . Hiatal hernia   . Hyperlipidemia   . Irritable bowel syndrome   . Obesity   . Rhinitis    Past Surgical History:  Procedure Laterality Date  . BACK SURGERY    . BILATERAL CARPAL TUNNEL RELEASE    . CHOLECYSTECTOMY    . neck fusion  2014    reports that  has never smoked. she has never used smokeless tobacco. She reports that she does not drink alcohol or use  drugs. family history includes COPD in her father; Cancer in her sister; Heart disease in her mother; Hypertension in her mother; Prostate cancer in her father; Sinusitis in her sister. Allergies  Allergen Reactions  . Fluoxetine Hcl    Current Outpatient Medications on File Prior to Visit  Medication Sig Dispense Refill  . B Complex Vitamins (B COMPLEX-B12 PO) Take 1 tablet by mouth daily.      . calcium-vitamin D (OSCAL WITH D) 500-200 MG-UNIT per tablet Take 1 tablet by mouth daily with breakfast.    . dextromethorphan (DELSYM) 30 MG/5ML liquid Take 60 mg by mouth as needed.      . loratadine (CLARITIN) 10 MG tablet Take 10 mg by mouth daily as needed.     . pantoprazole (PROTONIX) 40 MG tablet TAKE 1 TABLET 30 TO 60 MINUTES BEFORE FIRST MEAL OF THE DAY -MUST MAKE APPOINTMENT FOR REFILL- 30 tablet 2  . Phenylephrine-DM-GG-APAP (TYLENOL COLD MULTI-SYMPTOM) 5-10-200-325 MG TABS Take by mouth as needed.    . sertraline (ZOLOFT) 100 MG tablet TAKE 1 TABLET (100 MG TOTAL) BY MOUTH DAILY. 90 tablet 0  . sulindac (CLINORIL) 200 MG tablet On twice daily with meals as needed for joint pain 30 tablet 11  . tiZANidine (ZANAFLEX) 4 MG tablet Take 4 mg by mouth every 8 (eight) hours as needed for muscle spasms.     No current facility-administered medications on file prior to visit.  Review of Systems Constitutional: Negative for other unusual diaphoresis, sweats, appetite or weight changes HENT: Negative for other worsening hearing loss, ear pain, facial swelling, mouth sores or neck stiffness.   Eyes: Negative for other worsening pain, redness or other visual disturbance.  Respiratory: Negative for other stridor or swelling Cardiovascular: Negative for other palpitations or other chest pain  Gastrointestinal: Negative for worsening diarrhea or loose stools, blood in stool, distention or other pain Genitourinary: Negative for hematuria, flank pain or other change in urine volume.   Musculoskeletal: Negative for myalgias or other joint swelling.  Skin: Negative for other color change, or other wound or worsening drainage.  Neurological: Negative for other syncope or numbness. Hematological: Negative for other adenopathy or swelling Psychiatric/Behavioral: Negative for hallucinations, other worsening agitation, SI, self-injury, or new decreased concentration All other system neg per pt    Objective:   Physical Exam BP (!) 150/92   Pulse 68   Temp 98.1 F (36.7 C) (Oral)   Ht 5\' 9"  (1.753 m)   Wt 159 lb (72.1 kg)   SpO2 98%   BMI 23.48 kg/m  VS noted,  Constitutional: Pt is oriented to person, place, and time. Appears well-developed and well-nourished, in no significant distress and comfortable Head: Normocephalic and atraumatic  Eyes: Conjunctivae and EOM are normal. Pupils are equal, round, and reactive to light Right Ear: External ear normal without discharge Left Ear: External ear normal without discharge Nose: Nose without discharge or deformity Mouth/Throat: Oropharynx is without other ulcerations and moist  Neck: Normal range of motion. Neck supple. No JVD present. No tracheal deviation present or significant neck LA or mass Cardiovascular: Normal rate, regular rhythm, normal heart sounds and intact distal pulses.   Pulmonary/Chest: WOB normal and breath sounds without rales or wheezing  Abdominal: Soft. Bowel sounds are normal. NT. No HSM  Musculoskeletal: Normal range of motion. Exhibits no edema Lymphadenopathy: Has no other cervical adenopathy.  Neurological: Pt is alert and oriented to person, place, and time. Pt has normal reflexes. No cranial nerve deficit. Motor grossly intact, Gait intact Skin: Skin is warm and dry. No rash noted or new ulcerations Psychiatric:  Has mild nervous mood and affect. Behavior is normal without agitation No other exam findings Lab Results  Component Value Date   WBC 9.2 02/28/2017   HGB 13.2 02/28/2017   HCT  40.2 02/28/2017   PLT 246.0 02/28/2017   GLUCOSE 92 02/28/2017   CHOL 180 02/28/2017   TRIG 146.0 02/28/2017   HDL 47.00 02/28/2017   LDLDIRECT 148.0 02/29/2012   LDLCALC 104 (H) 02/28/2017   ALT 10 02/28/2017   AST 15 02/28/2017   NA 136 02/28/2017   K 3.6 02/28/2017   CL 101 02/28/2017   CREATININE 0.77 02/28/2017   BUN 19 02/28/2017   CO2 29 02/28/2017   TSH 1.39 02/28/2017          Assessment & Plan:

## 2017-02-28 NOTE — Assessment & Plan Note (Signed)

## 2017-03-01 LAB — HEPATITIS C ANTIBODY
HEP C AB: NONREACTIVE
SIGNAL TO CUT-OFF: 0.01 (ref <1.00)

## 2017-03-02 ENCOUNTER — Other Ambulatory Visit: Payer: Self-pay

## 2017-03-02 MED ORDER — PANTOPRAZOLE SODIUM 40 MG PO TBEC: DELAYED_RELEASE_TABLET | ORAL | 2 refills | Status: DC

## 2017-03-08 ENCOUNTER — Other Ambulatory Visit: Payer: PPO

## 2017-03-10 ENCOUNTER — Ambulatory Visit (INDEPENDENT_AMBULATORY_CARE_PROVIDER_SITE_OTHER)
Admission: RE | Admit: 2017-03-10 | Discharge: 2017-03-10 | Disposition: A | Discharge status: Home / Self Care | Payer: PPO | Source: Ambulatory Visit | Attending: Internal Medicine | Admitting: Internal Medicine

## 2017-03-10 DIAGNOSIS — Z0000 Encounter for general adult medical examination without abnormal findings: Secondary | ICD-10-CM

## 2017-03-10 DIAGNOSIS — Z13820 Encounter for screening for osteoporosis: Secondary | ICD-10-CM | POA: Diagnosis not present

## 2017-04-14 ENCOUNTER — Other Ambulatory Visit: Payer: Self-pay | Admitting: Internal Medicine

## 2017-04-17 NOTE — Telephone Encounter (Signed)
Done erx 

## 2017-04-18 ENCOUNTER — Ambulatory Visit (AMBULATORY_SURGERY_CENTER): Payer: Self-pay

## 2017-04-18 VITALS — Ht 68.5 in | Wt 162.6 lb

## 2017-04-18 DIAGNOSIS — Z86010 Personal history of colonic polyps: Secondary | ICD-10-CM

## 2017-04-18 MED ORDER — NA SULFATE-K SULFATE-MG SULF 17.5-3.13-1.6 GM/177ML PO SOLN: 1.0000 | Freq: Once | ORAL | 0 refills | Status: AC

## 2017-04-18 NOTE — Progress Notes (Signed)
Per pt, no allergies to soy or egg products.Pt not taking any weight loss meds or using  O2 at home.  Pt refused emmi video. 

## 2017-04-21 ENCOUNTER — Encounter: Payer: Self-pay | Admitting: Gastroenterology

## 2017-04-26 DIAGNOSIS — H40012 Open angle with borderline findings, low risk, left eye: Secondary | ICD-10-CM | POA: Diagnosis not present

## 2017-04-26 DIAGNOSIS — H401111 Primary open-angle glaucoma, right eye, mild stage: Secondary | ICD-10-CM | POA: Diagnosis not present

## 2017-05-02 ENCOUNTER — Encounter: Payer: PPO | Admitting: Gastroenterology

## 2017-05-25 ENCOUNTER — Other Ambulatory Visit: Payer: Self-pay | Admitting: Internal Medicine

## 2017-06-09 ENCOUNTER — Telehealth: Payer: Self-pay | Admitting: Internal Medicine

## 2017-06-09 DIAGNOSIS — Z6824 Body mass index (BMI) 24.0-24.9, adult: Secondary | ICD-10-CM | POA: Diagnosis not present

## 2017-06-09 DIAGNOSIS — Z124 Encounter for screening for malignant neoplasm of cervix: Secondary | ICD-10-CM | POA: Diagnosis not present

## 2017-06-09 DIAGNOSIS — N904 Leukoplakia of vulva: Secondary | ICD-10-CM | POA: Diagnosis not present

## 2017-06-09 DIAGNOSIS — N8111 Cystocele, midline: Secondary | ICD-10-CM | POA: Diagnosis not present

## 2017-06-09 DIAGNOSIS — Z1389 Encounter for screening for other disorder: Secondary | ICD-10-CM | POA: Diagnosis not present

## 2017-06-09 DIAGNOSIS — Z1231 Encounter for screening mammogram for malignant neoplasm of breast: Secondary | ICD-10-CM | POA: Diagnosis not present

## 2017-06-09 DIAGNOSIS — N816 Rectocele: Secondary | ICD-10-CM | POA: Diagnosis not present

## 2017-06-09 DIAGNOSIS — Z01419 Encounter for gynecological examination (general) (routine) without abnormal findings: Secondary | ICD-10-CM | POA: Diagnosis not present

## 2017-06-09 LAB — HM MAMMOGRAPHY

## 2017-06-09 MED ORDER — SERTRALINE HCL 50 MG PO TABS
50.0000 mg | ORAL_TABLET | Freq: Every day | ORAL | 2 refills | Status: DC
Start: 2017-06-09 — End: 2018-04-23

## 2017-06-09 NOTE — Telephone Encounter (Signed)
Patient came by the office regarding some medication refills.  She is needing a refill on sulindac (CLINORIL) 200 MG tablet and asked if this dose could be increased any? She said that she is still having a lot of pain. She asked if a new prescription for  sertraline (ZOLOFT) could be sent in for 50mg . She has decreased her past dose and does not want to have to keep breaking them. Pharmacy: [facility].  Please advise.

## 2017-06-09 NOTE — Telephone Encounter (Signed)
Pt has been informed and expressed understanding.  

## 2017-06-09 NOTE — Telephone Encounter (Signed)
Ok for zolfot - done erx  cliniril is already at the highest dose, unable to change, thanks

## 2017-07-06 DIAGNOSIS — Z124 Encounter for screening for malignant neoplasm of cervix: Secondary | ICD-10-CM | POA: Diagnosis not present

## 2017-07-06 DIAGNOSIS — Z01419 Encounter for gynecological examination (general) (routine) without abnormal findings: Secondary | ICD-10-CM | POA: Diagnosis not present

## 2017-07-27 DIAGNOSIS — H40012 Open angle with borderline findings, low risk, left eye: Secondary | ICD-10-CM | POA: Diagnosis not present

## 2017-07-27 DIAGNOSIS — H401111 Primary open-angle glaucoma, right eye, mild stage: Secondary | ICD-10-CM | POA: Diagnosis not present

## 2017-08-11 DIAGNOSIS — R1032 Left lower quadrant pain: Secondary | ICD-10-CM | POA: Diagnosis not present

## 2017-08-11 DIAGNOSIS — R102 Pelvic and perineal pain: Secondary | ICD-10-CM | POA: Diagnosis not present

## 2017-08-16 ENCOUNTER — Ambulatory Visit (AMBULATORY_SURGERY_CENTER): Payer: Self-pay | Admitting: *Deleted

## 2017-08-16 ENCOUNTER — Other Ambulatory Visit: Payer: Self-pay

## 2017-08-16 VITALS — Ht 68.0 in | Wt 163.0 lb

## 2017-08-16 DIAGNOSIS — Z86010 Personal history of colonic polyps: Secondary | ICD-10-CM

## 2017-08-16 MED ORDER — PEG 3350-KCL-NA BICARB-NACL 420 G PO SOLR: 4000.0000 mL | Freq: Once | ORAL | 0 refills | Status: AC

## 2017-08-16 NOTE — Progress Notes (Signed)
No egg or soy allergy known to patient  No issues with past sedation with any surgeries  or procedures, no intubation problems  No diet pills per patient No home 02 use per patient  No blood thinners per patient  Pt denies issues with constipation  No A fib or A flutter  EMMI video sent to pt's e mail  

## 2017-08-23 ENCOUNTER — Encounter: Payer: Self-pay | Admitting: Gastroenterology

## 2017-08-30 ENCOUNTER — Encounter: Payer: Self-pay | Admitting: Gastroenterology

## 2017-08-30 ENCOUNTER — Ambulatory Visit (AMBULATORY_SURGERY_CENTER): Payer: PPO | Admitting: Gastroenterology

## 2017-08-30 VITALS — BP 127/108 | HR 53 | Temp 98.7°F | Resp 13 | Ht 68.0 in | Wt 163.0 lb

## 2017-08-30 DIAGNOSIS — D120 Benign neoplasm of cecum: Secondary | ICD-10-CM

## 2017-08-30 DIAGNOSIS — D122 Benign neoplasm of ascending colon: Secondary | ICD-10-CM

## 2017-08-30 DIAGNOSIS — Z86010 Personal history of colonic polyps: Secondary | ICD-10-CM

## 2017-08-30 DIAGNOSIS — Z1211 Encounter for screening for malignant neoplasm of colon: Secondary | ICD-10-CM | POA: Diagnosis not present

## 2017-08-30 MED ORDER — SODIUM CHLORIDE 0.9 % IV SOLN: 500.0000 mL | Freq: Once | INTRAVENOUS | Status: DC

## 2017-08-30 NOTE — Op Note (Signed)
[facility] Patient Name: Savannah [name] Procedure Date: 08/30/2017 10:30 AM MRN: [id number] Endoscopist: Ladene Artist , MD Age: 67 Referring MD:  Date of Birth: 06/29/1950 Gender: Female Account #: [id number] Procedure:                Colonoscopy Indications:              Surveillance: Personal history of adenomatous                            polyps on last colonoscopy > 5 years ago Medicines:                Monitored Anesthesia Care Procedure:                Pre-Anesthesia Assessment:                           - Prior to the procedure, a History and Physical                            was performed, and patient medications and                            allergies were reviewed. The patient's tolerance of                            previous anesthesia was also reviewed. The risks                            and benefits of the procedure and the sedation                            options and risks were discussed with the patient.                            All questions were answered, and informed consent                            was obtained. Prior Anticoagulants: The patient has                            taken no previous anticoagulant or antiplatelet                            agents. ASA Grade Assessment: II - A patient with                            mild systemic disease. After reviewing the risks                            and benefits, the patient was deemed in                            satisfactory condition to undergo the procedure.  After obtaining informed consent, the colonoscope                            was passed under direct vision. Throughout the                            procedure, the patient's blood pressure, pulse, and                            oxygen saturations were monitored continuously. The                            Colonoscope was introduced through the anus and                            advanced to the the cecum,  identified by                            appendiceal orifice and ileocecal valve. The                            ileocecal valve, appendiceal orifice, and rectum                            were photographed. The quality of the bowel                            preparation was good. The colonoscopy was performed                            without difficulty. The patient tolerated the                            procedure well. Scope In: 10:37:17 AM Scope Out: 10:55:25 AM Scope Withdrawal Time: 0 hours 14 minutes 13 seconds  Total Procedure Duration: 0 hours 18 minutes 8 seconds  Findings:                 The perianal and digital rectal examinations were                            normal.                           A 7 mm polyp was found in the ascending colon. The                            polyp was sessile. The polyp was removed with a                            cold snare. Resection and retrieval were complete.                           A 5 mm polyp was found in the cecum. The polyp was  sessile. The polyp was removed with a cold biopsy                            forceps. Resection and retrieval were complete.                           Multiple small-mouthed diverticula were found in                            the left colon. There was no evidence of                            diverticular bleeding.                           Internal hemorrhoids were found during                            retroflexion. The hemorrhoids were small and Grade                            I (internal hemorrhoids that do not prolapse).                           The exam was otherwise without abnormality on                            direct and retroflexion views. Complications:            No immediate complications. Estimated blood loss:                            None. Estimated Blood Loss:     Estimated blood loss: none. Impression:               - One 7 mm polyp in the ascending  colon, removed                            with a cold snare. Resected and retrieved.                           - One 5 mm polyp in the cecum, removed with a cold                            biopsy forceps. Resected and retrieved.                           - Mild diverticulosis in the left colon.                           - Internal hemorrhoids.                           - The examination was otherwise normal on direct  and retroflexion views. Recommendation:           - Repeat colonoscopy in 5 years for surveillance.                           - Patient has a contact number available for                            emergencies. The signs and symptoms of potential                            delayed complications were discussed with the                            patient. Return to normal activities tomorrow.                            Written discharge instructions were provided to the                            patient.                           - Resume previous diet.                           - Continue present medications.                           - Await pathology results. Ladene Artist, MD 08/30/2017 11:00:24 AM This report has been signed electronically.

## 2017-08-30 NOTE — Progress Notes (Signed)
A and O x3. Report to RN. Tolerated MAC anesthesia well.

## 2017-08-30 NOTE — Progress Notes (Signed)
Called to room to assist during endoscopic procedure.  Patient ID and intended procedure confirmed with present staff. Received instructions for my participation in the procedure from the performing physician.  

## 2017-08-30 NOTE — Patient Instructions (Signed)
Thank you for allowing [place] to care for you today!  Resume current diet and medications.  Repeat colonoscopy in 5 years.  Await pathology results by mail, approximately 2 weeks.     YOU HAD AN ENDOSCOPIC PROCEDURE TODAY AT [facility]:   Refer to the procedure report that was given to you for any specific questions about what was found during the examination.  If the procedure report does not answer your questions, please call your gastroenterologist to clarify.  If you requested that your care partner not be given the details of your procedure findings, then the procedure report has been included in a sealed envelope for you to review at your convenience later.  YOU SHOULD EXPECT: Some feelings of bloating in the abdomen. Passage of more gas than usual.  Walking can help get rid of the air that was put into your GI tract during the procedure and reduce the bloating. If you had a lower endoscopy (such as a colonoscopy or flexible sigmoidoscopy) you may notice spotting of blood in your stool or on the toilet paper. If you underwent a bowel prep for your procedure, you may not have a normal bowel movement for a few days.  Please Note:  You might notice some irritation and congestion in your nose or some drainage.  This is from the oxygen used during your procedure.  There is no need for concern and it should clear up in a day or so.  SYMPTOMS TO REPORT IMMEDIATELY:   Following lower endoscopy (colonoscopy or flexible sigmoidoscopy):  Excessive amounts of blood in the stool  Significant tenderness or worsening of abdominal pains  Swelling of the abdomen that is new, acute  Fever of 100F or higher    For urgent or emergent issues, a gastroenterologist can be reached at any hour by calling [phone].   DIET:  We do recommend a small meal at first, but then you may proceed to your regular diet.  Drink plenty of fluids but you should avoid alcoholic beverages for 24  hours.  ACTIVITY:  You should plan to take it easy for the rest of today and you should NOT DRIVE or use heavy machinery until tomorrow (because of the sedation medicines used during the test).    FOLLOW UP: Our staff will call the number listed on your records the next business day following your procedure to check on you and address any questions or concerns that you may have regarding the information given to you following your procedure. If we do not reach you, we will leave a message.  However, if you are feeling well and you are not experiencing any problems, there is no need to return our call.  We will assume that you have returned to your regular daily activities without incident.  If any biopsies were taken you will be contacted by phone or by letter within the next 1-3 weeks.  Please call us at [phone] if you have not heard about the biopsies in 3 weeks.    SIGNATURES/CONFIDENTIALITY: You and/or your care partner have signed paperwork which will be entered into your electronic medical record.  These signatures attest to the fact that that the information above on your After Visit Summary has been reviewed and is understood.  Full responsibility of the confidentiality of this discharge information lies with you and/or your care-partner.

## 2017-08-30 NOTE — Progress Notes (Signed)
I have reviewed the patient's medical history in detail and updated the computerized patient record.

## 2017-08-31 ENCOUNTER — Telehealth: Payer: Self-pay

## 2017-08-31 NOTE — Telephone Encounter (Signed)
Left message

## 2017-08-31 NOTE — Telephone Encounter (Signed)
Post procedure follow up call, no answer 

## 2017-09-07 ENCOUNTER — Encounter: Payer: Self-pay | Admitting: Gastroenterology

## 2017-12-06 DIAGNOSIS — H401111 Primary open-angle glaucoma, right eye, mild stage: Secondary | ICD-10-CM | POA: Diagnosis not present

## 2018-02-02 ENCOUNTER — Ambulatory Visit
Admission: EM | Admit: 2018-02-02 | Discharge: 2018-02-02 | Disposition: A | Discharge status: Home / Self Care | Payer: PPO | Attending: Emergency Medicine | Admitting: Emergency Medicine

## 2018-02-02 DIAGNOSIS — H6121 Impacted cerumen, right ear: Secondary | ICD-10-CM | POA: Diagnosis not present

## 2018-02-02 DIAGNOSIS — B9789 Other viral agents as the cause of diseases classified elsewhere: Secondary | ICD-10-CM

## 2018-02-02 DIAGNOSIS — J069 Acute upper respiratory infection, unspecified: Secondary | ICD-10-CM | POA: Diagnosis not present

## 2018-02-02 MED ORDER — CETIRIZINE-PSEUDOEPHEDRINE ER 5-120 MG PO TB12: 1.0000 | ORAL_TABLET | Freq: Every day | ORAL | 0 refills | Status: DC

## 2018-02-02 MED ORDER — FLUTICASONE PROPIONATE 50 MCG/ACT NA SUSP: 2.0000 | Freq: Every day | NASAL | 0 refills | Status: DC

## 2018-02-02 NOTE — ED Triage Notes (Signed)
Pt c/o head congestion with sore throat and ear pain x2days

## 2018-02-02 NOTE — Discharge Instructions (Addendum)
Ear lavage performed Get plenty of rest and push fluids Zyrtec-D prescribed for nasal congestion, runny nose, and/or sore throat Flonase prescribed for nasal congestion and runny nose Use medications daily for symptom relief Use OTC medications like ibuprofen or tylenol as needed fever or pain Follow up with PCP if symptoms persist Return or go to ER if you have any new or worsening symptoms fever, chills, nausea, vomiting, chest pain, cough, shortness of breath, wheezing, abdominal pain, changes in bowel or bladder habits, etc..Marland Kitchen

## 2018-02-02 NOTE — ED Provider Notes (Signed)
[facility]   [id number] 02/02/18 Arrival Time: 1027   CC: URI symptoms   SUBJECTIVE: History from: patient.  JAICE LAGUE is a 67 y.o. female who presents with abrupt onset of RT ear pain, nasal congestion, runny nose, PND, sore throat, and cough for the past couple of days.  Denies positive sick exposure or precipitating event.  Has tried OTC medications without relief.  Symptoms are made worse at night.  Reports previous symptoms in the past. Complains of associated fatigue.  Denies fever, chills, SOB, wheezing, chest pain, nausea, changes in bowel or bladder habits.    Received flu shot this year: no.  ROS: As per HPI.  Past Medical History:  Diagnosis Date  . Allergy   . 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 Cataract    had surgery  . Cough   . Diverticulosis   . GERD (gastroesophageal reflux disease)   . Headache(784.0)   . Healthcare maintenance   . Hiatal hernia   . Hyperlipidemia   . Irritable bowel syndrome   . Obesity   . Rhinitis    Past Surgical History:  Procedure Laterality Date  . BACK SURGERY    . BILATERAL CARPAL TUNNEL RELEASE    . CATARACT EXTRACTION, BILATERAL  2018  . CHOLECYSTECTOMY    . COLONOSCOPY    . neck fusion  2014   Allergies  Allergen Reactions  . Fluoxetine Hcl     Pt does not remember the reaction.   Current Facility-Administered Medications on File Prior to Encounter  Medication Dose Route Frequency Provider Last Rate Last Dose  . 0.9 %  sodium chloride infusion  500 mL Intravenous Once Ladene Artist, MD       Current Outpatient Medications on File Prior to Encounter  Medication Sig Dispense Refill  . B Complex Vitamins (B COMPLEX-B12 PO) Take 1 tablet by mouth daily.      . calcium-vitamin D (OSCAL WITH D) 500-200 MG-UNIT per tablet Take 1 tablet by mouth daily with breakfast.    . pantoprazole (PROTONIX) 40 MG tablet TAKE 1 TABLET 30 TO 60 MINUTES BEFORE FIRST MEAL OF THE DAY -MUST MAKE APPOINTMENT FOR REFILL- 90 tablet  3  . sertraline (ZOLOFT) 50 MG tablet Take 1 tablet (50 mg total) by mouth daily. 90 tablet 2  . sulindac (CLINORIL) 200 MG tablet On twice daily with meals as needed for joint pain (Patient not taking: Reported on 08/16/2017) 30 tablet 11  . tiZANidine (ZANAFLEX) 4 MG tablet Take 4 mg by mouth every 8 (eight) hours as needed for muscle spasms.     Social History   Socioeconomic History  . Marital status: Married    Spouse name: Not on file  . Number of children: Not on file  . Years of education: Not on file  . Highest education level: Not on file  Occupational History  . Occupation: Hydrologist: [employer]  . Financial resource strain: Not on file  . Food insecurity:    Worry: Not on file    Inability: Not on file  . Transportation needs:    Medical: Not on file    Non-medical: Not on file  Tobacco Use  . Smoking status: Never Smoker  . Smokeless tobacco: Never Used  Substance and Sexual Activity  . Alcohol use: No    Alcohol/week: 0.0 standard drinks  . Drug use: No  . Sexual activity: Not on file  Lifestyle  . Physical activity:    Days per week: Not on file    Minutes per session: Not on file  . Stress: Not on file  Relationships  . Social connections:    Talks on phone: Not on file    Gets together: Not on file    Attends religious service: Not on file    Active member of club or organization: Not on file    Attends meetings of clubs or organizations: Not on file    Relationship status: Not on file  . Intimate partner violence:    Fear of current or ex partner: Not on file    Emotionally abused: Not on file    Physically abused: Not on file    Forced sexual activity: Not on file  Other Topics Concern  . 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 Prostate cancer Father   . COPD Father        smoker  . Hypertension  Mother   . Heart disease Mother        pacemaker  . Sinusitis Sister   . Cancer Sister        leukemia  . Abdominal Wall Hernia Brother   . Colon polyps Sister   . Colon cancer Neg Hx   . Esophageal cancer Neg Hx   . Pancreatic cancer Neg Hx   . Rectal cancer Neg Hx   . Stomach cancer Neg Hx     OBJECTIVE:  Vitals:   02/02/18 1040  BP: 131/82  Pulse: 75  Resp: 18  Temp: 99 F (37.2 C)  TempSrc: Oral  SpO2: 96%     General appearance: alert; appears mildly fatigued, but nontoxic; speaking in full sentences and tolerating own secretions HEENT: NCAT; Ears: LT ceruminous, TM pearly gray, RT EAC with cerumen impaction; Eyes: PERRL.  EOM grossly intact. Sinuses: nontender; Nose: nares patent with mild rhinorrhea, Throat: oropharynx clear, tonsils non erythematous or enlarged, uvula midline  Neck: supple without LAD Lungs: unlabored respirations, symmetrical air entry; cough: mild; no respiratory distress; CTAB Heart: regular rate and rhythm.  Radial pulses 2+ symmetrical bilaterally Skin: warm and dry Psychological: alert and cooperative; normal mood and affect  PROCEDURE:  Consent granted.  Right ear lavage performed by RN.  TM visualized.  PT tolerated procedure well.    ASSESSMENT & PLAN:  1. Impacted cerumen of right ear   2. Viral URI with cough     Meds ordered this encounter  Medications  . cetirizine-pseudoephedrine (ZYRTEC-D) 5-120 MG tablet    Sig: Take 1 tablet by mouth daily.    Dispense:  20 tablet    Refill:  0    Order Specific Question:   Supervising Provider    Answer:   Raylene Everts [[id number]]  . fluticasone (FLONASE) 50 MCG/ACT nasal spray    Sig: Place 2 sprays into both nostrils daily.    Dispense:  16 g    Refill:  0    Order Specific Question:   Supervising Provider    Answer:   Raylene Everts [[id number]]   Ear lavage performed Get plenty of rest and push fluids Zyrtec-D prescribed for nasal congestion, runny nose, and/or sore  throat Flonase prescribed for nasal congestion and runny nose Use medications daily for symptom relief Use OTC medications like ibuprofen or tylenol as needed fever or  pain Follow up with PCP if symptoms persist Return or go to ER if you have any new or worsening symptoms fever, chills, nausea, vomiting, chest pain, cough, shortness of breath, wheezing, abdominal pain, changes in bowel or bladder habits, etc...  Reviewed expectations re: course of current medical issues. Questions answered. Outlined signs and symptoms indicating need for more acute intervention. Patient verbalized understanding. After Visit Summary given.         Lestine Box, PA-C 02/02/18 1127

## 2018-03-02 ENCOUNTER — Encounter: Payer: Self-pay | Admitting: Internal Medicine

## 2018-03-02 ENCOUNTER — Other Ambulatory Visit: Payer: Self-pay | Admitting: Internal Medicine

## 2018-03-02 ENCOUNTER — Other Ambulatory Visit (INDEPENDENT_AMBULATORY_CARE_PROVIDER_SITE_OTHER): Payer: PPO

## 2018-03-02 ENCOUNTER — Ambulatory Visit (INDEPENDENT_AMBULATORY_CARE_PROVIDER_SITE_OTHER): Payer: PPO | Admitting: Internal Medicine

## 2018-03-02 VITALS — BP 160/92 | HR 60 | Temp 97.8°F | Ht 68.0 in | Wt 168.0 lb

## 2018-03-02 DIAGNOSIS — R030 Elevated blood-pressure reading, without diagnosis of hypertension: Secondary | ICD-10-CM | POA: Insufficient documentation

## 2018-03-02 DIAGNOSIS — Z0000 Encounter for general adult medical examination without abnormal findings: Secondary | ICD-10-CM

## 2018-03-02 DIAGNOSIS — Z23 Encounter for immunization: Secondary | ICD-10-CM | POA: Diagnosis not present

## 2018-03-02 DIAGNOSIS — E785 Hyperlipidemia, unspecified: Secondary | ICD-10-CM

## 2018-03-02 LAB — HEPATIC FUNCTION PANEL
ALT: 14 U/L (ref 0–35)
AST: 20 U/L (ref 0–37)
Albumin: 4 g/dL (ref 3.5–5.2)
Alkaline Phosphatase: 62 U/L (ref 39–117)
Bilirubin, Direct: 0.1 mg/dL (ref 0.0–0.3)
Total Bilirubin: 0.6 mg/dL (ref 0.2–1.2)
Total Protein: 6.8 g/dL (ref 6.0–8.3)

## 2018-03-02 LAB — LIPID PANEL
Cholesterol: 197 mg/dL (ref 0–200)
HDL: 57.8 mg/dL (ref >39.00)
LDL Cholesterol: 125 mg/dL — ABNORMAL HIGH (ref 0–99)
NonHDL: 139.11
Total CHOL/HDL Ratio: 3
Triglycerides: 70 mg/dL (ref 0.0–149.0)
VLDL: 14 mg/dL (ref 0.0–40.0)

## 2018-03-02 LAB — BASIC METABOLIC PANEL
BUN: 17 mg/dL (ref 6–23)
CO2: 28 mEq/L (ref 19–32)
Calcium: 9.3 mg/dL (ref 8.4–10.5)
Chloride: 106 mEq/L (ref 96–112)
Creatinine, Ser: 0.72 mg/dL (ref 0.40–1.20)
GFR: 80.18 mL/min (ref >60.00)
Glucose, Bld: 89 mg/dL (ref 70–99)
Potassium: 4.6 mEq/L (ref 3.5–5.1)
SODIUM: 140 meq/L (ref 135–145)

## 2018-03-02 LAB — CBC WITH DIFFERENTIAL/PLATELET
Basophils Absolute: 0 10*3/uL (ref 0.0–0.1)
Basophils Relative: 0.6 % (ref 0.0–3.0)
Eosinophils Absolute: 0.2 10*3/uL (ref 0.0–0.7)
Eosinophils Relative: 3.5 % (ref 0.0–5.0)
HCT: 39.5 % (ref 36.0–46.0)
Hemoglobin: 13.4 g/dL (ref 12.0–15.0)
Lymphocytes Relative: 31.4 % (ref 12.0–46.0)
Lymphs Abs: 1.8 10*3/uL (ref 0.7–4.0)
MCHC: 34.1 g/dL (ref 30.0–36.0)
MCV: 90.8 fl (ref 78.0–100.0)
Monocytes Absolute: 0.6 10*3/uL (ref 0.1–1.0)
Monocytes Relative: 10.2 % (ref 3.0–12.0)
Neutro Abs: 3.2 10*3/uL (ref 1.4–7.7)
Neutrophils Relative %: 54.3 % (ref 43.0–77.0)
PLATELETS: 250 10*3/uL (ref 150.0–400.0)
RBC: 4.35 Mil/uL (ref 3.87–5.11)
RDW: 13 % (ref 11.5–15.5)
WBC: 5.9 10*3/uL (ref 4.0–10.5)

## 2018-03-02 LAB — URINALYSIS, ROUTINE W REFLEX MICROSCOPIC
Bilirubin Urine: NEGATIVE
Hgb urine dipstick: NEGATIVE
Ketones, ur: NEGATIVE
Nitrite: NEGATIVE
Specific Gravity, Urine: 1.02 (ref 1.000–1.030)
Total Protein, Urine: NEGATIVE
URINE GLUCOSE: NEGATIVE
UROBILINOGEN UA: 0.2 (ref 0.0–1.0)
pH: 5.5 (ref 5.0–8.0)

## 2018-03-02 LAB — TSH: TSH: 1.49 u[IU]/mL (ref 0.35–4.50)

## 2018-03-02 MED ORDER — SULINDAC 200 MG PO TABS: ORAL_TABLET | ORAL | 3 refills | Status: DC

## 2018-03-02 MED ORDER — ROSUVASTATIN CALCIUM 10 MG PO TABS: 10.0000 mg | ORAL_TABLET | Freq: Every day | ORAL | 3 refills | Status: DC

## 2018-03-02 MED ORDER — PANTOPRAZOLE SODIUM 40 MG PO TBEC: DELAYED_RELEASE_TABLET | ORAL | 3 refills | Status: DC

## 2018-03-02 MED ORDER — TIZANIDINE HCL 4 MG PO TABS: 4.0000 mg | ORAL_TABLET | Freq: Three times a day (TID) | ORAL | 3 refills | Status: DC | PRN

## 2018-03-02 NOTE — Patient Instructions (Addendum)
You had the flu shot today  Remember to hold on taking the decongestant unless your blood pressure at home is better at averaging less then 140/90  Please continue all other medications as before, and refills have been done if requested.  Please have the pharmacy call with any other refills you may need.  Please continue your efforts at being more active, low cholesterol diet, and weight control.  You are otherwise up to date with prevention measures today.  Please keep your appointments with your specialists as you may have planned  Please go to the LAB in the Basement (turn left off the elevator) for the tests to be done today  You will be contacted by phone if any changes need to be made immediately.  Otherwise, you will receive a letter about your results with an explanation, but please check with MyChart first.  Please remember to sign up for MyChart if you have not done so, as this will be important to you in the future with finding out test results, communicating by private email, and scheduling acute appointments online when needed.  Please return in 1 year for your yearly visit, or sooner if needed, with Lab testing done 3-5 days before

## 2018-03-02 NOTE — Assessment & Plan Note (Signed)

## 2018-03-02 NOTE — Assessment & Plan Note (Signed)
Likely related to sudafed use, to stop, check BP at home with goal at least less than 140/90

## 2018-03-02 NOTE — Progress Notes (Signed)
Subjective:    Patient ID: Savannah [name], female    DOB: [birth year], 68 y.o.   MRN: [id number]  HPI  Here for wellness and f/u;  Overall doing ok;  Pt denies Chest pain, worsening SOB, DOE, wheezing, orthopnea, PND, worsening LE edema, palpitations, dizziness or syncope.  Pt denies neurological change such as new headache, facial or extremity weakness.  Pt denies polydipsia, polyuria, or low sugar symptoms. Pt states overall good compliance with treatment and medications, good tolerability, and has been trying to follow appropriate diet.  Pt denies worsening depressive symptoms, suicidal ideation or panic. No fever, night sweats, wt loss, loss of appetite, or other constitutional symptoms.  Pt states good ability with ADL's, has low fall risk, home safety reviewed and adequate, no other significant changes in hearing or vision, and only occasionally active with exercise. Had recent uri symptoms with occas ibuprofen for myalgais Taking an OTC decongestant zyrtec D.   BP Readings from Last 3 Encounters:  03/02/18 (!) 160/92  02/02/18 131/82  08/30/17 (!) 127/108  peak wt has been up to 220 in the past.  Wt Readings from Last 3 Encounters:  03/02/18 168 lb (76.2 kg)  08/30/17 163 lb (73.9 kg)  08/16/17 163 lb (73.9 kg)   Past Medical History:  Diagnosis Date  . Allergy   . 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 Cataract    had surgery  . Cough   . Diverticulosis   . GERD (gastroesophageal reflux disease)   . Headache(784.0)   . Healthcare maintenance   . Hiatal hernia   . Hyperlipidemia   . Irritable bowel syndrome   . Obesity   . Rhinitis    Past Surgical History:  Procedure Laterality Date  . BACK SURGERY    . BILATERAL CARPAL TUNNEL RELEASE    . CATARACT EXTRACTION, BILATERAL  2018  . CHOLECYSTECTOMY    . COLONOSCOPY    . neck fusion  2014    reports that she has never smoked. She has never used smokeless tobacco. She reports that she does not drink alcohol or use drugs. family  history includes Abdominal Wall Hernia in her brother; COPD in her father; Cancer in her sister; Colon polyps in her sister; Heart disease in her mother; Hypertension in her mother; Prostate cancer in her father; Sinusitis in her sister. Allergies  Allergen Reactions  . Fluoxetine Hcl     Pt does not remember the reaction.   Current Outpatient Medications on File Prior to Visit  Medication Sig Dispense Refill  . B Complex Vitamins (B COMPLEX-B12 PO) Take 1 tablet by mouth daily.      . calcium-vitamin D (OSCAL WITH D) 500-200 MG-UNIT per tablet Take 1 tablet by mouth daily with breakfast.    . cetirizine-pseudoephedrine (ZYRTEC-D) 5-120 MG tablet Take 1 tablet by mouth daily. 20 tablet 0  . fluticasone (FLONASE) 50 MCG/ACT nasal spray Place 2 sprays into both nostrils daily. 16 g 0  . pantoprazole (PROTONIX) 40 MG tablet TAKE 1 TABLET 30 TO 60 MINUTES BEFORE FIRST MEAL OF THE DAY -MUST MAKE APPOINTMENT FOR REFILL- 90 tablet 3  . sertraline (ZOLOFT) 50 MG tablet Take 1 tablet (50 mg total) by mouth daily. 90 tablet 2  . sulindac (CLINORIL) 200 MG tablet On twice daily with meals as needed for joint pain 30 tablet 11  . tiZANidine (ZANAFLEX) 4 MG tablet Take 4 mg by mouth every 8 (eight) hours as needed for muscle spasms.  Current Facility-Administered Medications on File Prior to Visit  Medication Dose Route Frequency Provider Last Rate Last Dose  . 0.9 %  sodium chloride infusion  500 mL Intravenous Once Ladene Artist, MD       Review of Systems Constitutional: Negative for other unusual diaphoresis, sweats, appetite or weight changes HENT: Negative for other worsening hearing loss, ear pain, facial swelling, mouth sores or neck stiffness.   Eyes: Negative for other worsening pain, redness or other visual disturbance.  Respiratory: Negative for other stridor or swelling Cardiovascular: Negative for other palpitations or other chest pain  Gastrointestinal: Negative for worsening  diarrhea or loose stools, blood in stool, distention or other pain Genitourinary: Negative for hematuria, flank pain or other change in urine volume.  Musculoskeletal: Negative for myalgias or other joint swelling.  Skin: Negative for other color change, or other wound or worsening drainage.  Neurological: Negative for other syncope or numbness. Hematological: Negative for other adenopathy or swelling Psychiatric/Behavioral: Negative for hallucinations, other worsening agitation, SI, self-injury, or new decreased concentration All other system neg per pt    Objective:   Physical Exam BP (!) 160/92   Pulse 60   Temp 97.8 F (36.6 C) (Oral)   Ht 5\' 8"  (1.727 m)   Wt 168 lb (76.2 kg)   SpO2 93%   BMI 25.54 kg/m  VS noted,  Constitutional: Pt is oriented to person, place, and time. Appears well-developed and well-nourished, in no significant distress and comfortable Head: Normocephalic and atraumatic  Eyes: Conjunctivae and EOM are normal. Pupils are equal, round, and reactive to light Right Ear: External ear normal without discharge Left Ear: External ear normal without discharge Nose: Nose without discharge or deformity Mouth/Throat: Oropharynx is without other ulcerations and moist  Neck: Normal range of motion. Neck supple. No JVD present. No tracheal deviation present or significant neck LA or mass Cardiovascular: Normal rate, regular rhythm, normal heart sounds and intact distal pulses.   Pulmonary/Chest: WOB normal and breath sounds without rales or wheezing  Abdominal: Soft. Bowel sounds are normal. NT. No HSM  Musculoskeletal: Normal range of motion. Exhibits no edema Lymphadenopathy: Has no other cervical adenopathy.  Neurological: Pt is alert and oriented to person, place, and time. Pt has normal reflexes. No cranial nerve deficit. Motor grossly intact, Gait intact Skin: Skin is warm and dry. No rash noted or new ulcerations Psychiatric:  Has normal mood and affect.  Behavior is normal without agitation        Assessment & Plan:

## 2018-03-02 NOTE — Assessment & Plan Note (Signed)
Pt would consider statin, but was diet controlled last yr

## 2018-04-23 ENCOUNTER — Other Ambulatory Visit: Payer: Self-pay | Admitting: Internal Medicine

## 2018-08-03 DIAGNOSIS — L259 Unspecified contact dermatitis, unspecified cause: Secondary | ICD-10-CM | POA: Diagnosis not present

## 2018-10-12 DIAGNOSIS — H401111 Primary open-angle glaucoma, right eye, mild stage: Secondary | ICD-10-CM | POA: Diagnosis not present

## 2018-10-12 DIAGNOSIS — H3561 Retinal hemorrhage, right eye: Secondary | ICD-10-CM | POA: Diagnosis not present

## 2019-01-04 DIAGNOSIS — H3561 Retinal hemorrhage, right eye: Secondary | ICD-10-CM | POA: Diagnosis not present

## 2019-01-04 DIAGNOSIS — H401111 Primary open-angle glaucoma, right eye, mild stage: Secondary | ICD-10-CM | POA: Diagnosis not present

## 2019-01-04 DIAGNOSIS — H3562 Retinal hemorrhage, left eye: Secondary | ICD-10-CM | POA: Diagnosis not present

## 2019-02-18 ENCOUNTER — Other Ambulatory Visit: Payer: Self-pay | Admitting: Internal Medicine

## 2019-03-04 ENCOUNTER — Telehealth: Payer: Self-pay

## 2019-03-04 ENCOUNTER — Encounter: Payer: PPO | Admitting: Internal Medicine

## 2019-03-04 NOTE — Telephone Encounter (Signed)
Called patient to do their pre-visit COVID screening.  Call patient twice. Both times call was answered & then hung up. The third time I was sent to voicemail. Unable to do prescreening.

## 2019-03-05 ENCOUNTER — Ambulatory Visit (INDEPENDENT_AMBULATORY_CARE_PROVIDER_SITE_OTHER): Payer: PPO | Admitting: Internal Medicine

## 2019-03-05 ENCOUNTER — Ambulatory Visit (INDEPENDENT_AMBULATORY_CARE_PROVIDER_SITE_OTHER): Payer: PPO

## 2019-03-05 ENCOUNTER — Other Ambulatory Visit: Payer: Self-pay

## 2019-03-05 ENCOUNTER — Encounter: Payer: Self-pay | Admitting: Internal Medicine

## 2019-03-05 VITALS — BP 180/81 | HR 62 | Temp 97.9°F | Resp 16 | Ht 67.72 in | Wt 182.0 lb

## 2019-03-05 DIAGNOSIS — R030 Elevated blood-pressure reading, without diagnosis of hypertension: Secondary | ICD-10-CM

## 2019-03-05 DIAGNOSIS — E785 Hyperlipidemia, unspecified: Secondary | ICD-10-CM

## 2019-03-05 DIAGNOSIS — M25511 Pain in right shoulder: Secondary | ICD-10-CM

## 2019-03-05 DIAGNOSIS — M19011 Primary osteoarthritis, right shoulder: Secondary | ICD-10-CM | POA: Diagnosis not present

## 2019-03-05 DIAGNOSIS — Z7689 Persons encountering health services in other specified circumstances: Secondary | ICD-10-CM

## 2019-03-05 MED ORDER — MELOXICAM 7.5 MG PO TABS: 7.5000 mg | ORAL_TABLET | Freq: Every day | ORAL | 1 refills | Status: AC

## 2019-03-05 NOTE — Patient Instructions (Addendum)
Please take your blood pressure at home in the morning prior to drinking any caffeine. Sit with both feet flat on the floor. Rest for at least 2 minutes prior to checking your blood pressure to allow it to normalize after any movement. Write this number down on your log and please bring your log to every office visit. Call the clinic if you have numbers consistently greater than 150 on the top or 100 on the bottom.  Shoulder Range of Motion Exercises Shoulder range of motion (ROM) exercises are done to keep the shoulder moving freely or to increase movement. They are often recommended for people who have shoulder pain or stiffness or who are recovering from a shoulder surgery. Phase 1 exercises When you are able, do this exercise 1-2 times per day for 30-60 seconds in each direction, or as directed by your health care provider. Pendulum exercise To do this exercise while sitting: 1. Sit in a chair or at the edge of your bed with your feet flat on the floor. 2. Let your affected arm hang down in front of you over the edge of the bed or chair. 3. Relax your shoulder, arm, and hand. [place] your body so your arm gently swings in small circles. You can also use your unaffected arm to start the motion. 5. Repeat changing the direction of the circles, swinging your arm left and right, and swinging your arm forward and back. To do this exercise while standing: 1. Stand next to a sturdy chair or table, and hold on to it with your hand on your unaffected side. 2. Bend forward at the waist. 3. Bend your knees slightly. 4. Relax your shoulder, arm, and hand. 5. While keeping your shoulder relaxed, use body motion to swing your arm in small circles. 6. Repeat changing the direction of the circles, swinging your arm left and right, and swinging your arm forward and back. 7. Between exercises, stand up tall and take a short break to relax your lower back.  Phase 2 exercises Do these exercises 1-2 times per  day or as told by your health care provider. Hold each stretch for 30 seconds, and repeat 3 times. Do the exercises with one or both arms as instructed by your health care provider. For these exercises, sit at a table with your hand and arm supported by the table. A chair that slides easily or has wheels can be helpful. External rotation 1. Turn your chair so that your affected side is nearest to the table. 2. Place your forearm on the table to your side. Bend your elbow about 90 at the elbow (right angle) and place your hand palm facing down on the table. Your elbow should be about 6 inches away from your side. 3. Keeping your arm on the table, lean your body forward. Abduction 1. Turn your chair so that your affected side is nearest to the table. 2. Place your forearm and hand on the table so that your thumb points toward the ceiling and your arm is straight out to your side. 3. Slide your hand out to the side and away from you, using your unaffected arm to do the work. 4. To increase the stretch, you can slide your chair away from the table. Flexion: forward stretch 1. Sit facing the table. Place your hand and elbow on the table in front of you. 2. Slide your hand forward and away from you, using your unaffected arm to do the work. 3. To increase  the stretch, you can slide your chair backward. Phase 3 exercises Do these exercises 1-2 times per day or as told by your health care provider. Hold each stretch for 30 seconds, and repeat 3 times. Do the exercises with one or both arms as instructed by your health care provider. Cross-body stretch: posterior capsule stretch 1. Lift your arm straight out in front of you. 2. Bend your arm 90 at the elbow (right angle) so your forearm moves across your body. 3. Use your other arm to gently pull the elbow across your body, toward your other shoulder. Wall climbs 1. Stand with your affected arm extended out to the side with your hand resting on a door  frame. 2. Slide your hand slowly up the door frame. 3. To increase the stretch, step through the door frame. Keep your body upright and do not lean. Wand exercises You will need a cane, a piece of PVC pipe, or a sturdy wooden dowel for wand exercises. Flexion To do this exercise while standing: 1. Hold the wand with both of your hands, palms down. 2. Using the other arm to help, lift your arms up and over your head, if able. 3. Push upward with your other arm to gently increase the stretch. To do this exercise while lying down: 1. Lie on your back with your elbows resting on the floor and the wand in both your hands. Your hands will be palm down, or pointing toward your feet. 2. Lift your hands toward the ceiling, using your unaffected arm to help if needed. 3. Bring your arms overhead as able, using your unaffected arm to help if needed. Internal rotation 1. Stand while holding the wand behind you with both hands. Your unaffected arm should be extended above your head with the arm of the affected side extended behind you at the level of your waist. The wand should be pointing straight up and down as you hold it. 2. Slowly pull the wand up behind your back by straightening the elbow of your unaffected arm and bending the elbow of your affected arm. External rotation 1. Lie on your back with your affected upper arm supported on a small pillow or rolled towel. When you first do this exercise, keep your upper arm close to your body. Over time, bring your arm up to a 90 angle out to the side. 2. Hold the wand across your stomach and with both hands palm up. Your elbow on your affected side should be bent at a 90 angle. 3. Use your unaffected side to help push your forearm away from you and toward the floor. Keep your elbow on your affected side bent at a 90 angle. Contact a health care provider if you have:  New or increasing pain.  New numbness, tingling, weakness, or discoloration in your  arm or hand. This information is not intended to replace advice given to you by your health care provider. Make sure you discuss any questions you have with your health care provider. Document Revised: 03/15/2017 Document Reviewed: 03/15/2017 Elsevier Patient Education  2020 [employer].

## 2019-03-05 NOTE — Progress Notes (Signed)
Subjective:    Savannah [name] - 69 y.o. female MRN [id number]  Date of birth: 1950/12/09  HPI  Savannah [name] is to establish care. Patient has a PMH significant for hyperlipidemia.   Arm Pain: Starts in right shoulder. Sometimes moves all the way into her elbow and down her hand. 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 No known event that caused start of shoulder pain. Started about one month ago. Pain is more constant than come and go. Has full ROM but feels a pull when she puts her arm over head. Feels pain at rest and with movement. Has taken Tylenol and has been using Voltaren gel. Has noted some relief with these treatments.   Elevated BP: Denies diagnosis of HTN. Reports BPs have been elevated at prior doctors visits in past with systolics of [id number].. Denies chest pain. Has been having some headaches. Prior PCP instructed her to obtain BP cuff and monitor at home but she has not been able to do that yet.    Social History   reports that she has never smoked. She has never used smokeless tobacco. She reports that she does not drink alcohol or use drugs.   Family History  family history includes Abdominal Wall Hernia in her brother; COPD in her father; Cancer in her sister; Colon polyps in her sister; Heart disease in her mother; Hypertension in her mother; Prostate cancer in her father; Sinusitis in her sister.     Health Maintenance:  There are no preventive care reminders to display for this patient.   Review of Systems  Constitutional: Negative for chills, fever, malaise/fatigue and weight loss.  HENT: Negative for congestion, ear pain and sore throat.   Eyes: Negative for blurred vision, discharge and redness.  Respiratory: Negative for cough, shortness of breath and wheezing.   Cardiovascular: Negative for chest pain, palpitations and leg swelling.  Gastrointestinal: Negative for abdominal pain, constipation, diarrhea, nausea and vomiting.  Genitourinary: Negative for dysuria,  frequency, hematuria and urgency.  Musculoskeletal: Negative for back pain and myalgias.  Skin: Negative for rash.  Neurological: Negative for dizziness, weakness and headaches.  Psychiatric/Behavioral: Negative for depression and suicidal ideas. The patient is not nervous/anxious.   All other systems reviewed and are negative.     Past Medical History: Patient Active Problem List   Diagnosis Date Noted  . Blood pressure elevated without history of HTN 03/02/2018  . Dyspnea 06/12/2014  . Osteopenia 04/28/2014  . GERD (gastroesophageal reflux disease) 03/15/2014  . Health care maintenance 03/15/2014  . Depression 09/14/2010  . HYPERTENSION, BENIGN 05/20/2009  . Hyperlipidemia LDL goal <160 01/18/2007  . HIATAL HERNIA 01/18/2007  . Irritable bowel syndrome 01/18/2007    Medications: reviewed and updated   Objective:   Physical Exam BP (!) 180/81   Pulse 62   Temp 97.9 F (36.6 C) (Oral)   Resp 16   Ht 5' 7.72" (1.72 m)   Wt 182 lb (82.6 kg)   SpO2 96%   BMI 27.90 kg/m  Physical Exam  Constitutional: She is oriented to person, place, and time and well-developed, well-nourished, and in no distress.  HENT:  Head: Normocephalic and atraumatic.  Mouth/Throat: Oropharynx is clear and moist.  Eyes: Pupils are equal, round, and reactive to light. Conjunctivae and EOM are normal.  Neck: No thyromegaly present.  Cardiovascular: Normal rate, regular rhythm, normal heart sounds and intact distal pulses.  No murmur heard. Pulmonary/Chest: Effort normal and breath sounds normal. No respiratory distress. She  has no wheezes.  Abdominal: Soft. Bowel sounds are normal. She exhibits no distension. There is no abdominal tenderness. There is no rebound and no guarding.  Musculoskeletal:        General: No deformity or edema. Normal range of motion.     Cervical back: Normal range of motion and neck supple.     Comments: Generalized tenderness over right shoulder and lateral upper arm. No  edema present. No joint deformity appreciated. Patient has full ROM at right shoulder. Negative empty can test. Negative Hawkin's test. Positive painful arc.   Lymphadenopathy:    She has no cervical adenopathy.  Neurological: She is alert and oriented to person, place, and time. Gait normal.  Strength 5/5 in UE bilaterally.   Skin: Skin is warm and dry. No rash noted. She is not diaphoretic.  Psychiatric: Mood, affect and judgment normal.        Assessment & Plan:   1. Encounter to establish care  2. Pain in joint of right shoulder Presently concerned for arthritis in shoulder joint with possible subacromial impingement. Rotator cuff tendonitis is possible although supraspinatus does not seem to be particularly impacted on exam. Doubt rotator cuff tear as patient has full ROM and strength in her right UE. Will obtain imaging to further evaluate. Prescribed NSAID and discussed other supportive care measures. Shoulder ROM exercises given. Patient has previously been seen by orthopedics in the past. If pain persists, she can be seen there for further evaluation and management. I will provide a referral if needed.   - DG Shoulder Right; Future - meloxicam (MOBIC) 7.5 MG tablet; Take 1 tablet (7.5 mg total) by mouth daily.  Dispense: 30 tablet; Refill: 1  3. Elevated blood-pressure reading without diagnosis of hypertension BP elevated today to 180/81. Patient asymptomatic and without prior history of HTN. Advised patient to start BP log and obtain BP cuff. Return within 1 month with log and repeat evaluation in the office.  - Basic Metabolic Panel  4. Hyperlipidemia, unspecified hyperlipidemia type Patient takes Crestor 10 mg. Check lipid panel and calculate ASCVD risk score to see if higher intensity statin would be beneficial.  - Lipid Panel     Phill Myron, D.O. 03/05/2019, 10:39 AM Primary Care at [facility]

## 2019-03-05 NOTE — Progress Notes (Signed)
physical  intermittant Pain in right arm radiates to hand and shoulder

## 2019-03-06 ENCOUNTER — Other Ambulatory Visit: Payer: Self-pay | Admitting: Internal Medicine

## 2019-03-06 LAB — LIPID PANEL
Chol/HDL Ratio: 2.7 ratio (ref 0.0–4.4)
Cholesterol, Total: 156 mg/dL (ref 100–199)
HDL: 57 mg/dL (ref >39)
LDL Chol Calc (NIH): 84 mg/dL (ref 0–99)
Triglycerides: 77 mg/dL (ref 0–149)
VLDL Cholesterol Cal: 15 mg/dL (ref 5–40)

## 2019-03-06 LAB — BASIC METABOLIC PANEL
BUN/Creatinine Ratio: 20 (ref 12–28)
BUN: 14 mg/dL (ref 8–27)
CO2: 27 mmol/L (ref 20–29)
Calcium: 9.5 mg/dL (ref 8.7–10.3)
Chloride: 104 mmol/L (ref 96–106)
Creatinine, Ser: 0.7 mg/dL (ref 0.57–1.00)
GFR calc Af Amer: 102 mL/min/{1.73_m2} (ref >59)
GFR calc non Af Amer: 88 mL/min/{1.73_m2} (ref >59)
Glucose: 83 mg/dL (ref 65–99)
Potassium: 4.3 mmol/L (ref 3.5–5.2)
Sodium: 144 mmol/L (ref 134–144)

## 2019-03-06 MED ORDER — ROSUVASTATIN CALCIUM 20 MG PO TABS: 20.0000 mg | ORAL_TABLET | Freq: Every day | ORAL | 3 refills | Status: DC

## 2019-03-06 NOTE — Progress Notes (Signed)
Patient notified of results & recommendations. Expressed understanding. Will give Mobic a try to see if the pain gets any better.

## 2019-03-06 NOTE — Progress Notes (Signed)
Patient notified of results & recommendations. Expressed understanding. Is agreeable to increasing Crestor to 20 mg & starting to take an 81 mg Aspirin.

## 2019-03-28 ENCOUNTER — Telehealth: Payer: Self-pay

## 2019-03-28 NOTE — Telephone Encounter (Signed)

## 2019-03-29 ENCOUNTER — Ambulatory Visit (INDEPENDENT_AMBULATORY_CARE_PROVIDER_SITE_OTHER): Payer: PPO | Admitting: Internal Medicine

## 2019-03-29 ENCOUNTER — Other Ambulatory Visit: Payer: Self-pay

## 2019-03-29 VITALS — BP 154/79 | HR 62 | Temp 97.2°F | Resp 17 | Wt 183.0 lb

## 2019-03-29 DIAGNOSIS — Z131 Encounter for screening for diabetes mellitus: Secondary | ICD-10-CM | POA: Diagnosis not present

## 2019-03-29 DIAGNOSIS — H3500 Unspecified background retinopathy: Secondary | ICD-10-CM | POA: Diagnosis not present

## 2019-03-29 DIAGNOSIS — I10 Essential (primary) hypertension: Secondary | ICD-10-CM

## 2019-03-29 MED ORDER — HYDROCHLOROTHIAZIDE 12.5 MG PO CAPS: 12.5000 mg | ORAL_CAPSULE | Freq: Every day | ORAL | 3 refills | Status: DC

## 2019-03-29 NOTE — Progress Notes (Signed)
  Subjective:    Savannah [name] - 69 y.o. female MRN [id number]  Date of birth: Oct 14, 1950  HPI  Savannah [name] is here for f/u elevated BP at last office visit without diagnosis of HTN. She has been keeping a thorough log at home. BPs have ranged from 0000000 systolic and [id number] diastolic. She feels well. Does have concerns about eye doctor telling her there were spots in her eyes that looked like burst blood vessels. He instructed her to be screened for DM.      Health Maintenance:  There are no preventive care reminders to display for this patient.  -  reports that she has never smoked. She has never used smokeless tobacco. - Review of Systems: Per HPI. - Past Medical History: Patient Active Problem List   Diagnosis Date Noted  . Osteopenia 04/28/2014  . GERD (gastroesophageal reflux disease) 03/15/2014  . Depression 09/14/2010  . Hyperlipidemia LDL goal <160 01/18/2007  . HIATAL HERNIA 01/18/2007  . Irritable bowel syndrome 01/18/2007   - Medications: reviewed and updated   Objective:   Physical Exam BP (!) 154/79   Pulse 62   Temp (!) 97.2 F (36.2 C) (Temporal)   Resp 17   Wt 183 lb (83 kg)   SpO2 97%   BMI 28.06 kg/m  Physical Exam  Constitutional: She is oriented to person, place, and time and well-developed, well-nourished, and in no distress. No distress.  Cardiovascular: Normal rate.  Pulmonary/Chest: Effort normal. No respiratory distress.  Musculoskeletal:        General: Normal range of motion.  Neurological: She is alert and oriented to person, place, and time.  Skin: Skin is warm and dry. She is not diaphoretic.  Psychiatric: Affect and judgment normal.           Assessment & Plan:   1. Essential hypertension Patient meets criteria to be diagnosed with HTN. Discussed we should initiate therapy; patient agreeable. Return in 1 week for BP check/BMET with start of HCTZ.  - hydrochlorothiazide (MICROZIDE) 12.5 MG capsule; Take 1 capsule (12.5 mg total) by  mouth daily.  Dispense: 30 capsule; Refill: 3 - Basic Metabolic Panel; Future  2. Diabetes mellitus screening - Hemoglobin A1c  3. Retinopathy Suspect eye doctor saw retinopathy. Discussed that previous glucoses on BMET have not been concerning for DM. Retinopathy might be related to uncontrolled HTN.  - Hemoglobin A1c      Phill Myron, D.O. 03/29/2019, 9:26 AM Primary Care at [facility]

## 2019-03-30 LAB — HEMOGLOBIN A1C
Est. average glucose Bld gHb Est-mCnc: 108 mg/dL
Hgb A1c MFr Bld: 5.4 % (ref 4.8–5.6)

## 2019-04-02 NOTE — Progress Notes (Signed)
Patient notified of results & recommendations. Expressed understanding.

## 2019-04-04 ENCOUNTER — Ambulatory Visit: Payer: PPO

## 2019-04-05 ENCOUNTER — Ambulatory Visit: Payer: PPO | Admitting: Internal Medicine

## 2019-04-05 DIAGNOSIS — H401111 Primary open-angle glaucoma, right eye, mild stage: Secondary | ICD-10-CM | POA: Diagnosis not present

## 2019-04-05 DIAGNOSIS — H35033 Hypertensive retinopathy, bilateral: Secondary | ICD-10-CM | POA: Diagnosis not present

## 2019-04-09 ENCOUNTER — Other Ambulatory Visit: Payer: PPO

## 2019-04-09 ENCOUNTER — Other Ambulatory Visit: Payer: Self-pay

## 2019-04-09 DIAGNOSIS — I10 Essential (primary) hypertension: Secondary | ICD-10-CM

## 2019-04-09 NOTE — Progress Notes (Signed)
Patient here for repeat BMP 

## 2019-04-10 LAB — BASIC METABOLIC PANEL
BUN/Creatinine Ratio: 17 (ref 12–28)
BUN: 13 mg/dL (ref 8–27)
CO2: 26 mmol/L (ref 20–29)
Calcium: 9.3 mg/dL (ref 8.7–10.3)
Chloride: 102 mmol/L (ref 96–106)
Creatinine, Ser: 0.78 mg/dL (ref 0.57–1.00)
GFR calc Af Amer: 89 mL/min/{1.73_m2} (ref >59)
GFR calc non Af Amer: 77 mL/min/{1.73_m2} (ref >59)
Glucose: 89 mg/dL (ref 65–99)
Potassium: 4.1 mmol/L (ref 3.5–5.2)
Sodium: 143 mmol/L (ref 134–144)

## 2019-04-12 NOTE — Progress Notes (Signed)
Patient notified of results & recommendations. Expressed understanding.

## 2019-04-18 ENCOUNTER — Other Ambulatory Visit: Payer: Self-pay

## 2019-04-18 ENCOUNTER — Ambulatory Visit (INDEPENDENT_AMBULATORY_CARE_PROVIDER_SITE_OTHER): Payer: PPO

## 2019-04-18 VITALS — BP 139/76 | HR 63

## 2019-04-18 DIAGNOSIS — I10 Essential (primary) hypertension: Secondary | ICD-10-CM | POA: Diagnosis not present

## 2019-04-18 MED ORDER — SERTRALINE HCL 50 MG PO TABS: 50.0000 mg | ORAL_TABLET | Freq: Every day | ORAL | 1 refills | Status: DC

## 2019-04-18 NOTE — Progress Notes (Signed)
Patient here for BP check. Has taken HCTZ this morning. After sitting BP was 139/76, pulse was 63. Spoke with provider who advised patient to continue medications & follow up in 3 months. KWalker, CMA.

## 2019-05-12 ENCOUNTER — Other Ambulatory Visit: Payer: Self-pay | Admitting: Internal Medicine

## 2019-05-12 NOTE — Telephone Encounter (Signed)
Please refill as per office routine med refill policy (all routine meds refilled for 3 mo or monthly per pt preference up to one year from last visit, then month to month grace period for 3 mo, then further med refills will have to be denied)  

## 2019-05-16 ENCOUNTER — Other Ambulatory Visit: Payer: Self-pay | Admitting: Internal Medicine

## 2019-05-16 NOTE — Telephone Encounter (Signed)
Please refill as per office routine med refill policy (all routine meds refilled for 3 mo or monthly per pt preference up to one year from last visit, then month to month grace period for 3 mo, then further med refills will have to be denied)  

## 2019-05-22 ENCOUNTER — Telehealth: Payer: Self-pay | Admitting: Internal Medicine

## 2019-05-22 MED ORDER — PANTOPRAZOLE SODIUM 40 MG PO TBEC: DELAYED_RELEASE_TABLET | ORAL | 1 refills | Status: DC

## 2019-05-22 NOTE — Telephone Encounter (Signed)
1) Medication(s) Requested (by name): pantoprazole (PROTONIX) 40 MG tablet [id number]   2) Pharmacy of Choice: [id number] - [place], [address] [place] [address]     Approved medications will be sent to pharmacy, we will reach out to you if there is an issue.  Requests made after 3pm may not be addressed until following business day!

## 2019-05-22 NOTE — Telephone Encounter (Signed)
Refills sent

## 2019-07-06 ENCOUNTER — Other Ambulatory Visit: Payer: Self-pay | Admitting: Internal Medicine

## 2019-07-23 ENCOUNTER — Ambulatory Visit: Payer: PPO | Admitting: Internal Medicine

## 2019-07-23 ENCOUNTER — Telehealth: Payer: Self-pay

## 2019-07-23 NOTE — Telephone Encounter (Signed)
Called patient to do their pre-visit COVID screening.  Call went to voicemail. Unable to do prescreening.  

## 2019-07-24 ENCOUNTER — Encounter: Payer: Self-pay | Admitting: Internal Medicine

## 2019-07-24 ENCOUNTER — Other Ambulatory Visit: Payer: Self-pay

## 2019-07-24 ENCOUNTER — Ambulatory Visit (INDEPENDENT_AMBULATORY_CARE_PROVIDER_SITE_OTHER): Payer: PPO | Admitting: Internal Medicine

## 2019-07-24 VITALS — BP 145/77 | HR 62 | Temp 97.3°F | Resp 17 | Ht 67.0 in | Wt 179.0 lb

## 2019-07-24 DIAGNOSIS — I10 Essential (primary) hypertension: Secondary | ICD-10-CM | POA: Diagnosis not present

## 2019-07-24 DIAGNOSIS — Z1231 Encounter for screening mammogram for malignant neoplasm of breast: Secondary | ICD-10-CM | POA: Diagnosis not present

## 2019-07-24 DIAGNOSIS — E785 Hyperlipidemia, unspecified: Secondary | ICD-10-CM | POA: Diagnosis not present

## 2019-07-24 MED ORDER — HYDROCHLOROTHIAZIDE 25 MG PO TABS: 25.0000 mg | ORAL_TABLET | Freq: Every day | ORAL | 1 refills | Status: DC

## 2019-07-24 MED ORDER — TIZANIDINE HCL 4 MG PO TABS: 4.0000 mg | ORAL_TABLET | Freq: Three times a day (TID) | ORAL | 1 refills | Status: AC | PRN

## 2019-07-24 NOTE — Progress Notes (Signed)
  Subjective:    Savannah [name] - 69 y.o. female MRN [id number]  Date of birth: 1951-01-20  HPI  Savannah [name] is here for follow up of chronic medical conditions.  Chronic HTN Disease Monitoring:  Home BP Monitoring - 130-150s/70-80ss  Chest pain- no  Dyspnea- no Headache - no  Medications: HCTZ 12.5 mg  Compliance- yes Lightheadedness- no  Edema- no   HYPERLIPIDEMIA  Symptoms Chest pain on exertion:  no  Leg claudication:   no  Medication Monitoring Compliance- Yes, with Crestor 20 mg  Right upper quadrant pain- no    Muscle aches- no    Health Maintenance:  Health Maintenance Due  Topic Date Due  . COVID-19 Vaccine (1) Never done  . MAMMOGRAM  06/10/2019    -  reports that she has never smoked. She has never used smokeless tobacco. - Review of Systems: Per HPI. - Past Medical History: Patient Active Problem List   Diagnosis Date Noted  . Essential hypertension 03/29/2019  . Osteopenia 04/28/2014  . GERD (gastroesophageal reflux disease) 03/15/2014  . Depression 09/14/2010  . Hyperlipidemia LDL goal <160 01/18/2007  . HIATAL HERNIA 01/18/2007  . Irritable bowel syndrome 01/18/2007   - Medications: reviewed and updated   Objective:   Physical Exam BP (!) 145/77   Pulse 62   Temp (!) 97.3 F (36.3 C) (Temporal)   Resp 17   Ht 5\' 7"  (1.702 m)   Wt 179 lb (81.2 kg)   SpO2 95%   BMI 28.04 kg/m  Physical Exam  Constitutional: She is oriented to person, place, and time and well-developed, well-nourished, and in no distress. No distress.  HENT:  Head: Normocephalic and atraumatic.  Eyes: Conjunctivae and EOM are normal.  Cardiovascular: Normal rate, regular rhythm and normal heart sounds.  No murmur heard. Pulmonary/Chest: Effort normal and breath sounds normal. No respiratory distress.  Musculoskeletal:        General: Normal range of motion.  Neurological: She is alert and oriented to person, place, and time.  Skin: Skin is warm and  dry. She is not diaphoretic.  Psychiatric: Affect and judgment normal.           Assessment & Plan:   1. Essential hypertension BP not at goal today and from report of home readings, have room for more optimal control. Will increase HCTZ to 25 mg daily. Return for repeat BMET in 1-2 weeks after dose change.  - hydrochlorothiazide (HYDRODIURIL) 25 MG tablet; Take 1 tablet (25 mg total) by mouth daily.  Dispense: 90 tablet; Refill: 1 - Basic metabolic panel; Future  2. Hyperlipidemia, unspecified hyperlipidemia type Last LDL was 88 in Jan 2021. Continue Crestor.   3. Breast cancer screening by mammogram - MM Digital Screening; Future    Phill Myron, D.O. 07/24/2019, 10:00 AM Primary Care at [facility]

## 2019-08-07 ENCOUNTER — Other Ambulatory Visit (INDEPENDENT_AMBULATORY_CARE_PROVIDER_SITE_OTHER): Payer: PPO

## 2019-08-07 ENCOUNTER — Other Ambulatory Visit: Payer: Self-pay

## 2019-08-07 DIAGNOSIS — I10 Essential (primary) hypertension: Secondary | ICD-10-CM

## 2019-08-08 ENCOUNTER — Other Ambulatory Visit: Payer: Self-pay | Admitting: Internal Medicine

## 2019-08-08 LAB — BASIC METABOLIC PANEL
BUN/Creatinine Ratio: 19 (ref 12–28)
BUN: 17 mg/dL (ref 8–27)
CO2: 29 mmol/L (ref 20–29)
Calcium: 9.2 mg/dL (ref 8.7–10.3)
Chloride: 101 mmol/L (ref 96–106)
Creatinine, Ser: 0.89 mg/dL (ref 0.57–1.00)
GFR calc Af Amer: 75 mL/min/{1.73_m2} (ref >59)
GFR calc non Af Amer: 65 mL/min/{1.73_m2} (ref >59)
Glucose: 97 mg/dL (ref 65–99)
Potassium: 3.5 mmol/L (ref 3.5–5.2)
Sodium: 143 mmol/L (ref 134–144)

## 2019-08-08 NOTE — Progress Notes (Signed)
Normal lab letter mailed.

## 2019-08-09 DIAGNOSIS — H35033 Hypertensive retinopathy, bilateral: Secondary | ICD-10-CM | POA: Diagnosis not present

## 2019-08-09 DIAGNOSIS — H401111 Primary open-angle glaucoma, right eye, mild stage: Secondary | ICD-10-CM | POA: Diagnosis not present

## 2019-10-17 ENCOUNTER — Telehealth: Payer: Self-pay | Admitting: Internal Medicine

## 2019-10-17 NOTE — Telephone Encounter (Signed)
Patient called in and requested for listed medication to be refilled and sent to [id number] - [place], [place] - [address]  [address] [place] [address]  sertraline (ZOLOFT) 50 MG tablet [[id number]]

## 2019-10-18 MED ORDER — SERTRALINE HCL 50 MG PO TABS: 50.0000 mg | ORAL_TABLET | Freq: Every day | ORAL | 1 refills | Status: DC

## 2019-11-23 ENCOUNTER — Other Ambulatory Visit: Payer: Self-pay | Admitting: Internal Medicine

## 2019-11-23 NOTE — Telephone Encounter (Signed)
Please refill as per office routine med refill policy (all routine meds refilled for 3 mo or monthly per pt preference up to one year from last visit, then month to month grace period for 3 mo, then further med refills will have to be denied)  

## 2019-12-02 ENCOUNTER — Other Ambulatory Visit: Payer: Self-pay | Admitting: Internal Medicine

## 2020-01-07 ENCOUNTER — Other Ambulatory Visit: Payer: Self-pay | Admitting: Internal Medicine

## 2020-01-07 DIAGNOSIS — I10 Essential (primary) hypertension: Secondary | ICD-10-CM

## 2020-03-20 ENCOUNTER — Other Ambulatory Visit: Payer: Self-pay | Admitting: Internal Medicine

## 2020-03-29 ENCOUNTER — Other Ambulatory Visit: Payer: Self-pay | Admitting: Internal Medicine

## 2020-03-29 DIAGNOSIS — I10 Essential (primary) hypertension: Secondary | ICD-10-CM

## 2020-04-06 ENCOUNTER — Other Ambulatory Visit: Payer: Self-pay

## 2020-04-06 ENCOUNTER — Telehealth: Payer: PPO | Admitting: Physician Assistant

## 2020-04-06 DIAGNOSIS — R059 Cough, unspecified: Secondary | ICD-10-CM

## 2020-04-06 DIAGNOSIS — J0110 Acute frontal sinusitis, unspecified: Secondary | ICD-10-CM

## 2020-04-06 MED ORDER — BENZONATATE 100 MG PO CAPS: 100.0000 mg | ORAL_CAPSULE | Freq: Two times a day (BID) | ORAL | 0 refills | Status: AC | PRN

## 2020-04-06 MED ORDER — AZITHROMYCIN 250 MG PO TABS: ORAL_TABLET | ORAL | 0 refills | Status: AC

## 2020-04-06 MED ORDER — FLUTICASONE PROPIONATE 50 MCG/ACT NA SUSP: 2.0000 | Freq: Every day | NASAL | 6 refills | Status: AC

## 2020-04-06 NOTE — Patient Instructions (Signed)
You will take azithromycin, I also sent Flonase and Tessalon Perles to your pharmacy. I encourage you to drink plenty of water, and get lots of rest.  I encourage you to follow-up with the mobile medicine unit or your primary care provider if you are not feeling better by next week.  Please let us know if there is anything else we can do for you, I hope that you feel better soon.  Kennieth Rad, PA-C Physician Assistant [facility] [email or web address]    Sinusitis, Adult Sinusitis is inflammation of your sinuses. Sinuses are hollow spaces in the bones around your face. Your sinuses are located:  Around your eyes.  In the middle of your forehead.  Behind your nose.  In your cheekbones. Mucus normally drains out of your sinuses. When your nasal tissues become inflamed or swollen, mucus can become trapped or blocked. This allows bacteria, viruses, and fungi to grow, which leads to infection. Most infections of the sinuses are caused by a virus. Sinusitis can develop quickly. It can last for up to 4 weeks (acute) or for more than 12 weeks (chronic). Sinusitis often develops after a cold. What are the causes? This condition is caused by anything that creates swelling in the sinuses or stops mucus from draining. This includes:  Allergies.  Asthma.  Infection from bacteria or viruses.  Deformities or blockages in your nose or sinuses.  Abnormal growths in the nose (nasal polyps).  Pollutants, such as chemicals or irritants in the air.  Infection from fungi (rare). What increases the risk? You are more likely to develop this condition if you:  Have a weak body defense system (immune system).  Do a lot of swimming or diving.  Overuse nasal sprays.  Smoke. What are the signs or symptoms? The main symptoms of this condition are pain and a feeling of pressure around the affected sinuses. Other symptoms include:  Stuffy  nose or congestion.  Thick drainage from your nose.  Swelling and warmth over the affected sinuses.  Headache.  Upper toothache.  A cough that may get worse at night.  Extra mucus that collects in the throat or the back of the nose (postnasal drip).  Decreased sense of smell and taste.  Fatigue.  A fever.  Sore throat.  Bad breath. How is this diagnosed? This condition is diagnosed based on:  Your symptoms.  Your medical history.  A physical exam.  Tests to find out if your condition is acute or chronic. This may include: ? Checking your nose for nasal polyps. ? Viewing your sinuses using a device that has a light (endoscope). ? Testing for allergies or bacteria. ? Imaging tests, such as an MRI or CT scan. In rare cases, a bone biopsy may be done to rule out more serious types of fungal sinus disease. How is this treated? Treatment for sinusitis depends on the cause and whether your condition is chronic or acute.  If caused by a virus, your symptoms should go away on their own within 10 days. You may be given medicines to relieve symptoms. They include: ? Medicines that shrink swollen nasal passages (topical intranasal decongestants). ? Medicines that treat allergies (antihistamines). ? A spray that eases inflammation of the nostrils (topical intranasal corticosteroids). ? Rinses that help get rid of thick mucus in your nose (nasal saline washes).  If caused by bacteria, your health care provider may recommend waiting to see if your symptoms improve. Most bacterial infections will get better without antibiotic  medicine. You may be given antibiotics if you have: ? A severe infection. ? A weak immune system.  If caused by narrow nasal passages or nasal polyps, you may need to have surgery. Follow these instructions at home: Medicines  Take, use, or apply over-the-counter and prescription medicines only as told by your health care provider. These may include nasal  sprays.  If you were prescribed an antibiotic medicine, take it as told by your health care provider. Do not stop taking the antibiotic even if you start to feel better. Hydrate and humidify  Drink enough fluid to keep your urine pale yellow. Staying hydrated will help to thin your mucus.  Use a cool mist humidifier to keep the humidity level in your home above 50%.  Inhale steam for 10-15 minutes, 3-4 times a day, or as told by your health care provider. You can do this in the bathroom while a hot shower is running.  Limit your exposure to cool or dry air.   Rest  Rest as much as possible.  Sleep with your head raised (elevated).  Make sure you get enough sleep each night. General instructions  Apply a warm, moist washcloth to your face 3-4 times a day or as told by your health care provider. This will help with discomfort.  Wash your hands often with soap and water to reduce your exposure to germs. If soap and water are not available, use hand sanitizer.  Do not smoke. Avoid being around people who are smoking (secondhand smoke).  Keep all follow-up visits as told by your health care provider. This is important.   Contact a health care provider if:  You have a fever.  Your symptoms get worse.  Your symptoms do not improve within 10 days. Get help right away if:  You have a severe headache.  You have persistent vomiting.  You have severe pain or swelling around your face or eyes.  You have vision problems.  You develop confusion.  Your neck is stiff.  You have trouble breathing. Summary  Sinusitis is soreness and inflammation of your sinuses. Sinuses are hollow spaces in the bones around your face.  This condition is caused by nasal tissues that become inflamed or swollen. The swelling traps or blocks the flow of mucus. This allows bacteria, viruses, and fungi to grow, which leads to infection.  If you were prescribed an antibiotic medicine, take it as told by  your health care provider. Do not stop taking the antibiotic even if you start to feel better.  Keep all follow-up visits as told by your health care provider. This is important. This information is not intended to replace advice given to you by your health care provider. Make sure you discuss any questions you have with your health care provider. Document Revised: 07/03/2017 Document Reviewed: 07/03/2017 Elsevier Patient Education  2021 [employer].

## 2020-04-06 NOTE — Progress Notes (Signed)
Established Patient Office Visit  Subjective:  Patient ID: Savannah [name], female    DOB: 03/11/1950  Age: 70 y.o. MRN: [id number]  CC:  Chief Complaint  Patient presents with  . Sinusitis   Virtual Visit via Telephone Note  I connected with Savannah [name] on 04/06/20 at  2:00 PM EST by telephone and verified that I am speaking with the correct person using two identifiers.  Location: Patient: Home Provider: [facility]    I discussed the limitations, risks, security and privacy concerns of performing an evaluation and management service by telephone and the availability of in person appointments. I also discussed with the patient that there may be a patient responsible charge related to this service. The patient expressed understanding and agreed to proceed.   History of Present Illness:  HPI Savannah [name] reports that she has been feeling poorly for the last 6 weeks. States that she does feel like she is improved, but is still having fatigue, body aches, headaches, right ear muffling, sinus pressure, dry cough. States that she has taken Tylenol without much relief. Denies any previous COVID vaccines, states that she did not get tested for Covid infection. States prior to becoming sick she was around her 63 year old mother-in-law who had similar symptoms, is unsure if she had Covid infection. Is eating and drinking okay    Observations/Objective: Medical history and current medications reviewed, no physical exam completed     Past Medical History:  Diagnosis Date  . Allergy   . 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 Cataract    had surgery  . Cough   . Diverticulitis   . Diverticulosis   . GERD (gastroesophageal reflux disease)   . Headache(784.0)   . Healthcare maintenance   . Hiatal hernia   . Hyperlipidemia   . Irritable bowel syndrome   . Obesity   . Rhinitis     Past Surgical History:  Procedure Laterality Date  . BACK SURGERY    . BILATERAL CARPAL  TUNNEL RELEASE    . CATARACT EXTRACTION, BILATERAL  2018  . CHOLECYSTECTOMY    . COLONOSCOPY    . neck fusion  2014    Family History  Problem Relation Age of Onset  . Prostate cancer Father   . COPD Father        smoker  . Cancer Father   . Hypertension Mother   . Heart disease Mother        pacemaker  . Arthritis Mother   . Sinusitis Sister   . Cancer Sister        leukemia  . Abdominal Wall Hernia Brother   . Colon polyps Sister   . Colon cancer Neg Hx   . Esophageal cancer Neg Hx   . Pancreatic cancer Neg Hx   . Rectal cancer Neg Hx   . Stomach cancer Neg Hx     Social History   Socioeconomic History  . Marital status: Married    Spouse name: Not on file  . Number of children: Not on file  . Years of education: Not on file  . Highest education level: Not on file  Occupational History  . Occupation: Hydrologist: [employer]  Tobacco Use  . Smoking status: Never Smoker  . Smokeless tobacco: Never Used  Vaping Use  . Vaping Use: Never used  Substance and Sexual Activity  . Alcohol use: No    Alcohol/week: 0.0  standard drinks  . Drug use: No  . Sexual activity: Not on file  Other Topics Concern  . 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 B Complex Vitamins (B COMPLEX-B12 PO) Take 1 tablet by mouth daily.    . calcium-vitamin D (OSCAL WITH D) 500-200 MG-UNIT per tablet Take 1 tablet by mouth daily with breakfast.    . hydrochlorothiazide (HYDRODIURIL) 25 MG tablet TAKE 1 TABLET (25 MG TOTAL) BY MOUTH DAILY. DX: I10 30 tablet 0  . meloxicam  (MOBIC) 7.5 MG tablet Take 1 tablet (7.5 mg total) by mouth daily. 30 tablet 1  . pantoprazole (PROTONIX) 40 MG tablet TAKE 1 TABLET BY MOUTH EVERY DAY 90 tablet 3  . rosuvastatin (CRESTOR) 20 MG tablet TAKE 1 TABLET BY MOUTH EVERY DAY 30 tablet 1  . sertraline (ZOLOFT) 50 MG tablet Take 1 tablet (50 mg total) by mouth daily. 90 tablet 1  . sulindac (CLINORIL) 200 MG tablet TAKE 1 TABLET BY MOUTH TWICE A DAY WITH A MEALS FOR JOINT PAIN 90 tablet 2  . tiZANidine (ZANAFLEX) 4 MG tablet Take 1 tablet (4 mg total) by mouth every 8 (eight) hours as needed for muscle spasms. 30 tablet 1   No facility-administered medications prior to visit.    Allergies  Allergen Reactions  . Fluoxetine Hcl     Pt does not remember the reaction.    ROS Review of Systems  Constitutional: Positive for fatigue. Negative for chills and fever.  HENT: Positive for sinus pressure and sinus pain. Negative for ear pain and trouble swallowing.   Eyes: Negative.   Respiratory: Positive for cough. Negative for shortness of breath and wheezing.   Cardiovascular: Negative for chest pain.  Gastrointestinal: Negative for nausea and vomiting.  Endocrine: Negative.   Genitourinary: Negative.   Musculoskeletal: Positive for myalgias.  Skin: Negative.   Allergic/Immunologic: Negative.   Neurological: Positive for headaches.  Hematological: Negative.   Psychiatric/Behavioral: Negative.       Objective:    There were no vitals taken for this visit. Wt Readings from Last 3 Encounters:  07/24/19 179 lb (81.2 kg)  03/29/19 183 lb (83 kg)  03/05/19 182 lb (82.6 kg)     Health Maintenance Due  Topic Date Due  . MAMMOGRAM  06/10/2019    There are no preventive care reminders to display for this patient.  Lab Results  Component Value Date   TSH 1.49 03/02/2018   Lab Results  Component Value Date   WBC 5.9 03/02/2018   HGB 13.4 03/02/2018   HCT 39.5 03/02/2018   MCV 90.8 03/02/2018   PLT 250.0 03/02/2018    Lab Results  Component Value Date   NA 143 08/07/2019   K 3.5 08/07/2019   CO2 29 08/07/2019   GLUCOSE 97 08/07/2019   BUN 17 08/07/2019   CREATININE 0.89 08/07/2019   BILITOT 0.6 03/02/2018   ALKPHOS 62 03/02/2018   AST 20 03/02/2018   ALT 14 03/02/2018   PROT 6.8 03/02/2018   ALBUMIN 4.0 03/02/2018   CALCIUM 9.2  08/07/2019   GFR 80.18 03/02/2018   Lab Results  Component Value Date   CHOL 156 03/05/2019   Lab Results  Component Value Date   HDL 57 03/05/2019   Lab Results  Component Value Date   LDLCALC 84 03/05/2019   Lab Results  Component Value Date   TRIG 77 03/05/2019   Lab Results  Component Value Date   CHOLHDL 2.7 03/05/2019   Lab Results  Component Value Date   HGBA1C 5.4 03/29/2019      Assessment & Plan:   Problem List Items Addressed This Visit   None   Visit Diagnoses    Acute frontal sinusitis, recurrence not specified    -  Primary   Relevant Medications   azithromycin (ZITHROMAX) 250 MG tablet   benzonatate (TESSALON) 100 MG capsule   fluticasone (FLONASE) 50 MCG/ACT nasal spray   Cough       Relevant Medications   benzonatate (TESSALON) 100 MG capsule     Assessment and Plan: 1. Acute frontal sinusitis, recurrence not specified Trial azithromycin, Flonase, Tessalon Perles for cough. Encouraged increase hydration, rest. Patient encouraged to follow-up with mobile medicine unit next week if no improvement. Red flags given for prompt reevaluation - azithromycin (ZITHROMAX) 250 MG tablet; Take 2 tabs PO day 1, then take 1 tab PO once daily  Dispense: 6 tablet; Refill: 0 - fluticasone (FLONASE) 50 MCG/ACT nasal spray; Place 2 sprays into both nostrils daily.  Dispense: 16 g; Refill: 6  2. Cough  - benzonatate (TESSALON) 100 MG capsule; Take 1 capsule (100 mg total) by mouth 2 (two) times daily as needed for cough.  Dispense: 20 capsule; Refill: 0   Follow Up Instructions:   I discussed the assessment and treatment plan with the  patient. The patient was provided an opportunity to ask questions and all were answered. The patient agreed with the plan and demonstrated an understanding of the instructions.   The patient was advised to call back or seek an in-person evaluation if the symptoms worsen or if the condition fails to improve as anticipated.  I provided 21 minutes of non-face-to-face time during this encounter.       Meds ordered this encounter  Medications  . azithromycin (ZITHROMAX) 250 MG tablet    Sig: Take 2 tabs PO day 1, then take 1 tab PO once daily    Dispense:  6 tablet    Refill:  0    Order Specific Question:   Supervising Provider    Answer:   Asencion Noble E [[id number]]  . benzonatate (TESSALON) 100 MG capsule    Sig: Take 1 capsule (100 mg total) by mouth 2 (two) times daily as needed for cough.    Dispense:  20 capsule    Refill:  0    Order Specific Question:   Supervising Provider    Answer:   Asencion Noble E [[id number]]  . fluticasone (FLONASE) 50 MCG/ACT nasal spray    Sig: Place 2 sprays into both nostrils daily.    Dispense:  16 g    Refill:  6    Order Specific Question:   Supervising Provider    Answer:   Elsie Stain [[id number]]    Follow-up: Return if symptoms worsen or fail to improve.    Savannah Grip Mayers, PA-C

## 2020-04-06 NOTE — Progress Notes (Signed)
Patient verified DOB Patient has taken tylenol. Patient has an appetite. Taste and smell is still present. Patient denies sore throat, Patient complains of pressure in the head and right ear stuffed. Patient reports dry tickle cough still present. Patient had last diarrhea this morning. Patient denies fevers at this time Patient reports intermittent chills. Patient still reports fatigue and body aches.

## 2020-04-12 ENCOUNTER — Other Ambulatory Visit: Payer: Self-pay | Admitting: Internal Medicine

## 2020-04-20 ENCOUNTER — Other Ambulatory Visit: Payer: Self-pay

## 2020-04-20 ENCOUNTER — Other Ambulatory Visit: Payer: Self-pay | Admitting: Internal Medicine

## 2020-04-20 MED ORDER — ROSUVASTATIN CALCIUM 20 MG PO TABS
20.0000 mg | ORAL_TABLET | Freq: Every day | ORAL | 0 refills | Status: DC
Start: 2020-04-20 — End: 2020-05-20

## 2020-04-22 ENCOUNTER — Other Ambulatory Visit: Payer: Self-pay

## 2020-04-22 ENCOUNTER — Ambulatory Visit (INDEPENDENT_AMBULATORY_CARE_PROVIDER_SITE_OTHER): Payer: PPO | Admitting: Internal Medicine

## 2020-04-22 ENCOUNTER — Encounter: Payer: Self-pay | Admitting: Internal Medicine

## 2020-04-22 VITALS — BP 149/88 | HR 69 | Temp 98.4°F | Resp 16 | Ht 68.0 in | Wt 183.6 lb

## 2020-04-22 DIAGNOSIS — I10 Essential (primary) hypertension: Secondary | ICD-10-CM | POA: Diagnosis not present

## 2020-04-22 DIAGNOSIS — H6121 Impacted cerumen, right ear: Secondary | ICD-10-CM | POA: Diagnosis not present

## 2020-04-22 DIAGNOSIS — E785 Hyperlipidemia, unspecified: Secondary | ICD-10-CM

## 2020-04-22 DIAGNOSIS — Z1239 Encounter for other screening for malignant neoplasm of breast: Secondary | ICD-10-CM

## 2020-04-22 DIAGNOSIS — Z0000 Encounter for general adult medical examination without abnormal findings: Secondary | ICD-10-CM

## 2020-04-22 NOTE — Progress Notes (Signed)
Subjective:    Savannah [name] - 70 y.o. female MRN [id number]  Date of birth: 06/16/1950  HPI  Savannah [name] is here for annual exam. Has some concerns about feeling like she is struggling to hear out of R ear and it feels clogged.   Has been compliant with HCTZ. Stopped monitoring BP at home when readings were in 130s/80s.   Depression screen [facility] 2/9 04/22/2020 04/06/2020 03/05/2019  Decreased Interest 0 0 1  Down, Depressed, Hopeless 0 0 0  PHQ - 2 Score 0 0 1  Altered sleeping - - 1  Tired, decreased energy - - 1  Change in appetite - - 0  Feeling bad or failure about yourself  - - 0  Trouble concentrating - - 0  Moving slowly or fidgety/restless - - 0  Suicidal thoughts - - 0  PHQ-9 Score - - 3   MMSE - Mini Mental State Exam 04/22/2020  Orientation to time 5  Orientation to Place 5  Registration 3  Attention/ Calculation 5  Recall 3  Language- name 2 objects 2  Language- repeat 0  Language- follow 3 step command 3  Language- read & follow direction 1  Write a sentence 1  Copy design 1  Total score 29    Health Maintenance:  Health Maintenance Due  Topic Date Due  . MAMMOGRAM  06/10/2019    -  reports that she has never smoked. She has never used smokeless tobacco. - Review of Systems: Per HPI. - Past Medical History: Patient Active Problem List   Diagnosis Date Noted  . Essential hypertension 03/29/2019  . Osteopenia 04/28/2014  . GERD (gastroesophageal reflux disease) 03/15/2014  . Depression 09/14/2010  . Hyperlipidemia LDL goal <160 01/18/2007  . HIATAL HERNIA 01/18/2007  . Irritable bowel syndrome 01/18/2007   - Medications: reviewed and updated   Objective:   Physical Exam BP (!) 157/79 (BP Location: Right Arm, Patient Position: Sitting, Cuff Size: Normal)   Pulse 69   Temp 98.4 F (36.9 C) (Oral)   Resp 16   Ht 5\' 8"  (1.727 m)   Wt 183 lb 9.6 oz (83.3 kg)   SpO2 97%   BMI 27.92 kg/m  Physical Exam Constitutional:      Appearance: She is not  diaphoretic.  HENT:     Head: Normocephalic and atraumatic.     Right Ear: There is impacted cerumen.     Mouth/Throat:     Mouth: Oropharynx is clear and moist.      Comments: TMs normal bilaterally Eyes:     Extraocular Movements: EOM normal.     Conjunctiva/sclera: Conjunctivae normal.     Pupils: Pupils are equal, round, and reactive to light.  Neck:     Thyroid: No thyromegaly.  Cardiovascular:     Rate and Rhythm: Normal rate and regular rhythm.     Pulses: Intact distal pulses.     Heart sounds: Normal heart sounds. No murmur heard.   Pulmonary:     Effort: Pulmonary effort is normal. No respiratory distress.     Breath sounds: Normal breath sounds. No wheezing.  Abdominal:     General: Bowel sounds are normal. There is no distension.     Palpations: Abdomen is soft.     Tenderness: There is no abdominal tenderness. There is no guarding or rebound.  Musculoskeletal:        General: No deformity or edema. Normal range of motion.     Cervical back: Normal range  of motion and neck supple.  Lymphadenopathy:     Cervical: No cervical adenopathy.  Skin:    General: Skin is warm and dry.     Findings: No rash.  Neurological:     Mental Status: She is alert and oriented to person, place, and time.     Gait: Gait is intact.  Psychiatric:        Mood and Affect: Mood and affect normal.        Judgment: Judgment normal.            Assessment & Plan:   1. Encounter for annual physical exam Counseled on 150 minutes of exercise per week, healthy eating (including decreased daily intake of saturated fats, cholesterol, added sugars, sodium), STI prevention, routine healthcare maintenance.  2. Essential hypertension BP above goal, 157/79 and 149/88 on repeat. Discussed option of starting additional therapy versus monitoring. Patient elected to return in 4 weeks for further monitoring.  Counseled on blood pressure goal of less than 130/80, low-sodium, DASH diet, medication  compliance, 150 minutes of moderate intensity exercise per week. Discussed medication compliance, adverse effects. - Comprehensive metabolic panel  3. Hyperlipidemia LDL goal <160 Continue Crestor. Monitor lipids.  - Lipid panel  4. Impacted cerumen of right ear Removed by Eddie Dibbles, RN.   5. Encounter for screening for malignant neoplasm of breast, unspecified screening modality Discussed screening for breast cancer. Patient declines.   Phill Myron, D.O. 04/22/2020, 9:03 AM Primary Care at [facility]

## 2020-04-23 LAB — COMPREHENSIVE METABOLIC PANEL
ALT: 20 IU/L (ref 0–32)
AST: 22 IU/L (ref 0–40)
Albumin/Globulin Ratio: 1.7 (ref 1.2–2.2)
Albumin: 4.5 g/dL (ref 3.7–4.7)
Alkaline Phosphatase: 67 IU/L (ref 44–121)
BUN/Creatinine Ratio: 17 (ref 12–28)
BUN: 12 mg/dL (ref 8–27)
Bilirubin Total: 0.4 mg/dL (ref 0.0–1.2)
CO2: 24 mmol/L (ref 20–29)
Calcium: 9.6 mg/dL (ref 8.7–10.3)
Chloride: 100 mmol/L (ref 96–106)
Creatinine, Ser: 0.71 mg/dL (ref 0.57–1.00)
Globulin, Total: 2.6 g/dL (ref 1.5–4.5)
Glucose: 94 mg/dL (ref 65–99)
Potassium: 3.5 mmol/L (ref 3.5–5.2)
Sodium: 140 mmol/L (ref 134–144)
Total Protein: 7.1 g/dL (ref 6.0–8.5)
eGFR: 91 mL/min/{1.73_m2} (ref >59)

## 2020-04-23 LAB — LIPID PANEL
Chol/HDL Ratio: 2.4 ratio (ref 0.0–4.4)
Cholesterol, Total: 134 mg/dL (ref 100–199)
HDL: 55 mg/dL (ref >39)
LDL Chol Calc (NIH): 67 mg/dL (ref 0–99)
Triglycerides: 56 mg/dL (ref 0–149)
VLDL Cholesterol Cal: 12 mg/dL (ref 5–40)

## 2020-04-27 ENCOUNTER — Other Ambulatory Visit: Payer: Self-pay | Admitting: Internal Medicine

## 2020-04-27 DIAGNOSIS — I10 Essential (primary) hypertension: Secondary | ICD-10-CM

## 2020-05-01 ENCOUNTER — Other Ambulatory Visit: Payer: Self-pay

## 2020-05-13 ENCOUNTER — Other Ambulatory Visit: Payer: Self-pay | Admitting: Internal Medicine

## 2020-05-20 ENCOUNTER — Other Ambulatory Visit: Payer: Self-pay

## 2020-05-20 ENCOUNTER — Ambulatory Visit: Payer: PPO

## 2020-05-20 ENCOUNTER — Other Ambulatory Visit: Payer: Self-pay | Admitting: Internal Medicine

## 2020-05-20 VITALS — BP 153/78 | HR 69

## 2020-05-20 DIAGNOSIS — I10 Essential (primary) hypertension: Secondary | ICD-10-CM

## 2020-05-20 DIAGNOSIS — E785 Hyperlipidemia, unspecified: Secondary | ICD-10-CM

## 2020-05-20 MED ORDER — ROSUVASTATIN CALCIUM 20 MG PO TABS: 20.0000 mg | ORAL_TABLET | Freq: Every day | ORAL | 0 refills | Status: AC

## 2020-05-20 MED ORDER — VALSARTAN 80 MG PO TABS: 80.0000 mg | ORAL_TABLET | Freq: Every day | ORAL | 1 refills | Status: AC

## 2020-05-20 NOTE — Progress Notes (Signed)
Rosuvastatin 20 mg refilled

## 2020-05-20 NOTE — Progress Notes (Signed)
Patient returned for BP check. BP still above goal despite trial of lifestyle changes. Start Valsartan 80 mg. Return in 1 week for BMET lab monitoring and BP check.   Phill Myron, D.O. Primary Care at [facility]  05/20/2020, 9:32 AM

## 2020-05-20 NOTE — Progress Notes (Signed)
BP check  

## 2020-05-20 NOTE — Patient Instructions (Signed)
Dr. Juleen China has added another medication to your regimen.  Please return in 1 week to make sure the medication is working.

## 2020-05-27 ENCOUNTER — Ambulatory Visit: Payer: PPO

## 2020-05-27 ENCOUNTER — Other Ambulatory Visit: Payer: Self-pay

## 2020-05-27 VITALS — BP 138/69 | HR 96

## 2020-05-27 DIAGNOSIS — I10 Essential (primary) hypertension: Secondary | ICD-10-CM

## 2020-05-27 NOTE — Progress Notes (Signed)
BP check  

## 2020-06-03 ENCOUNTER — Other Ambulatory Visit: Payer: Self-pay | Admitting: Internal Medicine

## 2020-06-03 ENCOUNTER — Other Ambulatory Visit: Payer: Self-pay

## 2020-06-03 DIAGNOSIS — I10 Essential (primary) hypertension: Secondary | ICD-10-CM

## 2020-06-03 DIAGNOSIS — E785 Hyperlipidemia, unspecified: Secondary | ICD-10-CM

## 2020-06-03 MED ORDER — HYDROCHLOROTHIAZIDE 25 MG PO TABS: 25.0000 mg | ORAL_TABLET | Freq: Every day | ORAL | 0 refills | Status: DC

## 2020-06-03 NOTE — Progress Notes (Signed)
HCTZ refilled. 

## 2020-08-20 DIAGNOSIS — R051 Acute cough: Secondary | ICD-10-CM | POA: Diagnosis not present

## 2020-08-20 DIAGNOSIS — J029 Acute pharyngitis, unspecified: Secondary | ICD-10-CM | POA: Diagnosis not present

## 2020-08-20 DIAGNOSIS — Z20828 Contact with and (suspected) exposure to other viral communicable diseases: Secondary | ICD-10-CM | POA: Diagnosis not present

## 2020-08-24 ENCOUNTER — Telehealth: Payer: Self-pay | Admitting: Internal Medicine

## 2020-08-24 NOTE — Telephone Encounter (Signed)
Patient called wanting to make sure her PCP was aware she was diagnosed with Covid 08/20/2020 and was given Paxlovid. e

## 2020-09-03 ENCOUNTER — Other Ambulatory Visit: Payer: Self-pay

## 2020-09-03 DIAGNOSIS — I10 Essential (primary) hypertension: Secondary | ICD-10-CM

## 2020-09-03 MED ORDER — HYDROCHLOROTHIAZIDE 25 MG PO TABS: 25.0000 mg | ORAL_TABLET | Freq: Every day | ORAL | 0 refills | Status: AC

## 2020-09-03 NOTE — Progress Notes (Signed)
Hydrochlorothiazide refilled for 90 days any future refills require appt

## 2020-11-08 ENCOUNTER — Other Ambulatory Visit: Payer: Self-pay | Admitting: Internal Medicine

## 2020-11-08 NOTE — Telephone Encounter (Signed)
Please refill as per office routine med refill policy (all routine meds to be refilled for 3 mo or monthly (per pt preference) up to one year from last visit, then month to month grace period for 3 mo, then further med refills will have to be denied) ? ?

## 2020-11-12 DIAGNOSIS — K219 Gastro-esophageal reflux disease without esophagitis: Secondary | ICD-10-CM | POA: Diagnosis not present

## 2020-11-12 DIAGNOSIS — Z5320 Procedure and treatment not carried out because of patient's decision for unspecified reasons: Secondary | ICD-10-CM | POA: Diagnosis not present

## 2020-11-12 DIAGNOSIS — M545 Low back pain, unspecified: Secondary | ICD-10-CM | POA: Diagnosis not present

## 2020-11-12 DIAGNOSIS — F3342 Major depressive disorder, recurrent, in full remission: Secondary | ICD-10-CM | POA: Diagnosis not present

## 2020-11-12 DIAGNOSIS — I10 Essential (primary) hypertension: Secondary | ICD-10-CM | POA: Diagnosis not present

## 2020-11-12 DIAGNOSIS — E785 Hyperlipidemia, unspecified: Secondary | ICD-10-CM | POA: Diagnosis not present

## 2020-12-09 DIAGNOSIS — H5203 Hypermetropia, bilateral: Secondary | ICD-10-CM | POA: Diagnosis not present

## 2020-12-09 DIAGNOSIS — Z961 Presence of intraocular lens: Secondary | ICD-10-CM | POA: Diagnosis not present

## 2020-12-09 DIAGNOSIS — H524 Presbyopia: Secondary | ICD-10-CM | POA: Diagnosis not present

## 2020-12-09 DIAGNOSIS — H401131 Primary open-angle glaucoma, bilateral, mild stage: Secondary | ICD-10-CM | POA: Diagnosis not present

## 2020-12-09 DIAGNOSIS — H26493 Other secondary cataract, bilateral: Secondary | ICD-10-CM | POA: Diagnosis not present

## 2020-12-09 DIAGNOSIS — H04123 Dry eye syndrome of bilateral lacrimal glands: Secondary | ICD-10-CM | POA: Diagnosis not present

## 2020-12-09 DIAGNOSIS — H52223 Regular astigmatism, bilateral: Secondary | ICD-10-CM | POA: Diagnosis not present

## 2020-12-21 DIAGNOSIS — M5416 Radiculopathy, lumbar region: Secondary | ICD-10-CM | POA: Diagnosis not present

## 2020-12-24 DIAGNOSIS — Z1231 Encounter for screening mammogram for malignant neoplasm of breast: Secondary | ICD-10-CM | POA: Diagnosis not present

## 2020-12-24 DIAGNOSIS — F3342 Major depressive disorder, recurrent, in full remission: Secondary | ICD-10-CM | POA: Diagnosis not present

## 2020-12-24 DIAGNOSIS — I10 Essential (primary) hypertension: Secondary | ICD-10-CM | POA: Diagnosis not present

## 2021-01-12 DIAGNOSIS — M545 Low back pain, unspecified: Secondary | ICD-10-CM | POA: Diagnosis not present

## 2021-01-28 DIAGNOSIS — M1611 Unilateral primary osteoarthritis, right hip: Secondary | ICD-10-CM | POA: Diagnosis not present

## 2021-04-05 ENCOUNTER — Other Ambulatory Visit: Payer: Self-pay | Admitting: Orthopedic Surgery

## 2021-04-05 DIAGNOSIS — G8929 Other chronic pain: Secondary | ICD-10-CM

## 2021-04-22 ENCOUNTER — Ambulatory Visit
Admission: RE | Admit: 2021-04-22 | Discharge: 2021-04-22 | Disposition: A | Discharge status: Home / Self Care | Payer: PPO | Source: Ambulatory Visit | Attending: Orthopedic Surgery | Admitting: Orthopedic Surgery

## 2021-04-22 ENCOUNTER — Other Ambulatory Visit: Payer: Self-pay

## 2021-04-22 DIAGNOSIS — M545 Low back pain, unspecified: Secondary | ICD-10-CM

## 2021-04-22 DIAGNOSIS — G8929 Other chronic pain: Secondary | ICD-10-CM

## 2021-08-13 ENCOUNTER — Other Ambulatory Visit: Payer: Self-pay | Admitting: Physician Assistant

## 2021-08-13 DIAGNOSIS — Z1231 Encounter for screening mammogram for malignant neoplasm of breast: Secondary | ICD-10-CM

## 2021-08-19 ENCOUNTER — Other Ambulatory Visit: Payer: Self-pay | Admitting: Physician Assistant

## 2021-08-19 DIAGNOSIS — E2839 Other primary ovarian failure: Secondary | ICD-10-CM

## 2021-09-07 ENCOUNTER — Ambulatory Visit
Admission: RE | Admit: 2021-09-07 | Discharge: 2021-09-07 | Disposition: A | Discharge status: Home / Self Care | Payer: PPO | Source: Ambulatory Visit | Attending: Physician Assistant | Admitting: Physician Assistant

## 2021-09-07 DIAGNOSIS — Z1231 Encounter for screening mammogram for malignant neoplasm of breast: Secondary | ICD-10-CM

## 2022-02-23 ENCOUNTER — Ambulatory Visit
Admission: RE | Admit: 2022-02-23 | Discharge: 2022-02-23 | Disposition: A | Discharge status: Home / Self Care | Payer: PPO | Source: Ambulatory Visit | Attending: Physician Assistant | Admitting: Physician Assistant

## 2022-02-23 DIAGNOSIS — E2839 Other primary ovarian failure: Secondary | ICD-10-CM

## 2022-07-13 ENCOUNTER — Encounter: Payer: Self-pay | Admitting: Gastroenterology

## 2022-08-16 ENCOUNTER — Other Ambulatory Visit: Payer: Self-pay | Admitting: Physician Assistant

## 2022-08-16 DIAGNOSIS — Z1231 Encounter for screening mammogram for malignant neoplasm of breast: Secondary | ICD-10-CM

## 2022-09-09 ENCOUNTER — Ambulatory Visit: Payer: PPO

## 2022-09-09 ENCOUNTER — Ambulatory Visit: Admission: RE | Admit: 2022-09-09 | Payer: PPO | Source: Ambulatory Visit

## 2022-09-09 DIAGNOSIS — Z1231 Encounter for screening mammogram for malignant neoplasm of breast: Secondary | ICD-10-CM

## 2023-08-08 ENCOUNTER — Other Ambulatory Visit: Payer: Self-pay | Admitting: Physician Assistant

## 2023-08-08 DIAGNOSIS — Z1231 Encounter for screening mammogram for malignant neoplasm of breast: Secondary | ICD-10-CM

## 2023-09-11 ENCOUNTER — Ambulatory Visit
Admission: RE | Admit: 2023-09-11 | Discharge: 2023-09-11 | Disposition: A | Discharge status: Home / Self Care | Source: Ambulatory Visit | Attending: Physician Assistant | Admitting: Physician Assistant

## 2023-09-11 DIAGNOSIS — Z1231 Encounter for screening mammogram for malignant neoplasm of breast: Secondary | ICD-10-CM
